# Patient Record
Sex: Female | Born: 1955
Health system: Southern US, Community
[De-identification: ages and names within clinical notes are randomized; demographics above are authoritative.]

## PROBLEM LIST (undated history)

## (undated) DIAGNOSIS — I10 Essential (primary) hypertension: Secondary | ICD-10-CM

## (undated) DIAGNOSIS — Z780 Asymptomatic menopausal state: Secondary | ICD-10-CM

## (undated) DIAGNOSIS — Z87891 Personal history of nicotine dependence: Secondary | ICD-10-CM

## (undated) DIAGNOSIS — E785 Hyperlipidemia, unspecified: Secondary | ICD-10-CM

## (undated) DIAGNOSIS — Z7189 Other specified counseling: Secondary | ICD-10-CM

## (undated) HISTORY — DX: Other specified counseling: Z71.89

## (undated) HISTORY — PX: OTHER SURGICAL HISTORY: SHX169

## (undated) HISTORY — DX: Personal history of nicotine dependence: Z87.891

## (undated) HISTORY — DX: Hyperlipidemia, unspecified: E78.5

## (undated) HISTORY — DX: Asymptomatic menopausal state: Z78.0

---
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
## 2000-08-13 ENCOUNTER — Other Ambulatory Visit: Admission: RE | Admit: 2000-08-13 | Discharge: 2000-08-13 | Payer: Self-pay | Admitting: Family Medicine

## 2001-11-25 ENCOUNTER — Ambulatory Visit (HOSPITAL_BASED_OUTPATIENT_CLINIC_OR_DEPARTMENT_OTHER): Admission: RE | Admit: 2001-11-25 | Discharge: 2001-11-25 | Payer: Self-pay | Admitting: Nurse Practitioner

## 2006-04-29 ENCOUNTER — Ambulatory Visit: Payer: Self-pay | Admitting: Family Medicine

## 2006-04-29 DIAGNOSIS — N951 Menopausal and female climacteric states: Secondary | ICD-10-CM

## 2006-04-29 DIAGNOSIS — L989 Disorder of the skin and subcutaneous tissue, unspecified: Secondary | ICD-10-CM | POA: Insufficient documentation

## 2006-04-29 DIAGNOSIS — E785 Hyperlipidemia, unspecified: Secondary | ICD-10-CM

## 2006-04-29 DIAGNOSIS — D649 Anemia, unspecified: Secondary | ICD-10-CM | POA: Insufficient documentation

## 2006-04-29 HISTORY — DX: Anemia, unspecified: D64.9

## 2006-04-29 HISTORY — DX: Menopausal and female climacteric states: N95.1

## 2006-05-03 ENCOUNTER — Encounter: Payer: Self-pay | Admitting: Family Medicine

## 2006-05-06 LAB — CONVERTED CEMR LAB
ALT: 26 units/L (ref 0–35)
AST: 22 units/L (ref 0–37)
Alkaline Phosphatase: 71 units/L (ref 39–117)
CO2: 26 meq/L (ref 19–32)
Chloride: 103 meq/L (ref 96–112)
HCT: 42.8 % (ref 36.0–46.0)
Potassium: 4.6 meq/L (ref 3.5–5.3)
RBC: 5.01 M/uL (ref 3.87–5.11)
Sodium: 139 meq/L (ref 135–145)
TSH: 1.396 microintl units/mL (ref 0.350–5.50)
Total Bilirubin: 0.4 mg/dL (ref 0.3–1.2)
Total Protein: 7.9 g/dL (ref 6.0–8.3)
WBC: 3.7 10*3/uL — ABNORMAL LOW (ref 4.0–10.5)

## 2006-05-27 ENCOUNTER — Encounter: Payer: Self-pay | Admitting: Family Medicine

## 2006-05-27 ENCOUNTER — Other Ambulatory Visit: Admission: RE | Admit: 2006-05-27 | Discharge: 2006-05-27 | Payer: Self-pay | Admitting: Family Medicine

## 2006-05-27 ENCOUNTER — Ambulatory Visit: Payer: Self-pay | Admitting: Family Medicine

## 2006-06-24 ENCOUNTER — Ambulatory Visit: Payer: Self-pay | Admitting: Gastroenterology

## 2006-07-04 ENCOUNTER — Encounter: Payer: Self-pay | Admitting: Family Medicine

## 2006-07-04 ENCOUNTER — Ambulatory Visit: Payer: Self-pay | Admitting: Gastroenterology

## 2006-07-04 HISTORY — PX: COLONOSCOPY: SHX174

## 2006-07-09 ENCOUNTER — Encounter: Payer: Self-pay | Admitting: Family Medicine

## 2007-04-25 ENCOUNTER — Ambulatory Visit: Payer: Self-pay | Admitting: Family Medicine

## 2007-04-25 DIAGNOSIS — D239 Other benign neoplasm of skin, unspecified: Secondary | ICD-10-CM

## 2007-04-25 HISTORY — DX: Other benign neoplasm of skin, unspecified: D23.9

## 2007-05-01 ENCOUNTER — Telehealth: Payer: Self-pay | Admitting: Family Medicine

## 2007-07-07 ENCOUNTER — Encounter: Payer: Self-pay | Admitting: Family Medicine

## 2007-07-24 ENCOUNTER — Ambulatory Visit: Payer: Self-pay | Admitting: Family Medicine

## 2008-12-24 ENCOUNTER — Other Ambulatory Visit: Admission: RE | Admit: 2008-12-24 | Discharge: 2008-12-24 | Payer: Self-pay | Admitting: Family Medicine

## 2008-12-24 ENCOUNTER — Ambulatory Visit: Payer: Self-pay | Admitting: Family Medicine

## 2008-12-24 DIAGNOSIS — R635 Abnormal weight gain: Secondary | ICD-10-CM | POA: Insufficient documentation

## 2008-12-27 ENCOUNTER — Encounter: Payer: Self-pay | Admitting: Family Medicine

## 2008-12-28 LAB — CONVERTED CEMR LAB
ALT: 43 units/L — ABNORMAL HIGH (ref 0–35)
Alkaline Phosphatase: 84 units/L (ref 39–117)
Cholesterol: 306 mg/dL — ABNORMAL HIGH (ref 0–200)
TSH: 1.566 microintl units/mL (ref 0.350–4.500)
Total Bilirubin: 0.3 mg/dL (ref 0.3–1.2)
Total CHOL/HDL Ratio: 4.9
Triglycerides: 177 mg/dL — ABNORMAL HIGH (ref <150)
VLDL: 35 mg/dL (ref 0–40)

## 2009-01-12 ENCOUNTER — Encounter: Payer: Self-pay | Admitting: Family Medicine

## 2009-01-12 ENCOUNTER — Encounter: Admission: RE | Admit: 2009-01-12 | Discharge: 2009-01-12 | Payer: Self-pay | Admitting: Family Medicine

## 2009-01-14 ENCOUNTER — Encounter: Payer: Self-pay | Admitting: Family Medicine

## 2010-02-07 NOTE — Miscellaneous (Signed)
Summary: Pap smear  Clinical Lists Changes  Observations: Added new observation of PAP DUE: 12/25/2010 (01/14/2009 15:01) Added new observation of TDBOOSTDUE: 05/26/2016 (01/14/2009 15:01) Added new observation of HDLNXTDUE: 12/27/2013 (01/14/2009 15:01) Added new observation of LDLNXTDUE: 12/27/2013 (01/14/2009 15:01) Added new observation of CREATNXTDUE: 12/27/2009 (01/14/2009 15:01) Added new observation of POTASSIUMDUE: 12/27/2009 (01/14/2009 15:01) Added new observation of LAST PAP DAT: 12/24/2008 (12/24/2008 15:02) Added new observation of PAP SMEAR: normal (12/24/2008 15:02)     PAP Result Date:  12/24/2008 PAP Result:  normal PAP Next Due:  2 yr Pls let pt know that her pap smear came back normal.  Seymour Bars, D.O.  Appended Document: Pap smear Pt aware

## 2010-04-20 ENCOUNTER — Encounter: Payer: Self-pay | Admitting: Family Medicine

## 2010-08-17 ENCOUNTER — Encounter: Payer: Self-pay | Admitting: Family Medicine

## 2010-08-17 ENCOUNTER — Ambulatory Visit (INDEPENDENT_AMBULATORY_CARE_PROVIDER_SITE_OTHER): Payer: Managed Care, Other (non HMO) | Admitting: Family Medicine

## 2010-08-17 DIAGNOSIS — R635 Abnormal weight gain: Secondary | ICD-10-CM

## 2010-08-17 DIAGNOSIS — D649 Anemia, unspecified: Secondary | ICD-10-CM

## 2010-08-17 DIAGNOSIS — I10 Essential (primary) hypertension: Secondary | ICD-10-CM

## 2010-08-17 DIAGNOSIS — Z13220 Encounter for screening for lipoid disorders: Secondary | ICD-10-CM

## 2010-08-17 DIAGNOSIS — Z131 Encounter for screening for diabetes mellitus: Secondary | ICD-10-CM

## 2010-08-17 HISTORY — DX: Essential (primary) hypertension: I10

## 2010-08-17 MED ORDER — TRIAMTERENE-HCTZ 37.5-25 MG PO TABS: 1.0000 | ORAL_TABLET | Freq: Every day | ORAL | Status: DC

## 2010-08-17 NOTE — Assessment & Plan Note (Signed)
BP is high today and was high in Dec 2010.  Her weight gain is likely to be a contributing factor.  Will start her on Maxzide today and info given on DASH diet.  Encouraged her to work on diet/ exercise/ wt loss, update labs today and return for her physical in 1 month.

## 2010-08-17 NOTE — Progress Notes (Signed)
  Subjective:    Patient ID: Ashley Haynes, female    DOB: 03/15/1955, 55 y.o.   MRN: [id number]  HPI  55 yo AAF presents for weight gain and elevated BP.  She is out of work.  She went back to school.  Her mom passed away in 04/27/2022.  Her husband is helped out but he has also gained weight.  She is doing some exercise.  She feels like her face turns red when she puts her arms overhead but there is no dizziness or pain.  She is walking or doing workouts at home on and off.  She is about 3 yrs postmenopausal.  She admits to eating worse lately.  She does tend to retain water.  Denies any chest pain or DOE.  Her mom had a CABG at age 18.  Her father has HTN.    BP 154/79  Pulse 68  Ht 4\' 11"  (1.499 m)  Wt 180 lb (81.647 kg)  BMI 36.36 kg/m2  SpO2 98%   Review of Systems  Constitutional: Negative for fatigue and unexpected weight change.  Eyes: Negative for visual disturbance.  Respiratory: Negative for shortness of breath.   Cardiovascular: Negative for chest pain, palpitations and leg swelling.  Genitourinary: Negative for difficulty urinating.  Neurological: Negative for headaches.  Psychiatric/Behavioral: Negative for dysphoric mood. The patient is not nervous/anxious.        Objective:   Physical Exam  Constitutional: She appears well-developed and well-nourished.  HENT:  Mouth/Throat: Oropharynx is clear and moist.  Eyes: Pupils are equal, round, and reactive to light.  Neck: Neck supple. No thyromegaly present.  Cardiovascular: Normal rate, regular rhythm and normal heart sounds.   No murmur heard. Pulmonary/Chest: Effort normal and breath sounds normal.  Musculoskeletal: She exhibits no edema.  Psychiatric: She has a normal mood and affect.          Assessment & Plan:

## 2010-08-17 NOTE — Patient Instructions (Signed)
Read thru info on DASH diet to work on diet/ exercise changes.  Start Maxzide once daily for high BP.  Aim for walking 45 min 4 days/ wk.  Labs today. Will call you w/ results tomorrow.  Return for a PHYSICAL in 1 month.

## 2010-08-18 ENCOUNTER — Telehealth: Payer: Self-pay | Admitting: Family Medicine

## 2010-08-18 LAB — CBC WITH DIFFERENTIAL/PLATELET
Basophils Relative: 0 % (ref 0–1)
Eosinophils Absolute: 0.1 10*3/uL (ref 0.0–0.7)
Lymphocytes Relative: 53 % — ABNORMAL HIGH (ref 12–46)
MCHC: 33 g/dL (ref 30.0–36.0)
MCV: 83.8 fL (ref 78.0–100.0)
Monocytes Absolute: 0.4 10*3/uL (ref 0.1–1.0)
Monocytes Relative: 9 % (ref 3–12)
Neutro Abs: 1.6 10*3/uL — ABNORMAL LOW (ref 1.7–7.7)
RBC: 4.99 MIL/uL (ref 3.87–5.11)
RDW: 15.3 % (ref 11.5–15.5)

## 2010-08-18 LAB — LIPID PANEL
Cholesterol: 266 mg/dL — ABNORMAL HIGH (ref 0–200)
LDL Cholesterol: 188 mg/dL — ABNORMAL HIGH (ref 0–99)
Triglycerides: 113 mg/dL (ref <150)
VLDL: 23 mg/dL (ref 0–40)

## 2010-08-18 LAB — COMPLETE METABOLIC PANEL WITH GFR
Alkaline Phosphatase: 73 U/L (ref 39–117)
CO2: 28 mEq/L (ref 19–32)
Calcium: 9.6 mg/dL (ref 8.4–10.5)
Chloride: 102 mEq/L (ref 96–112)
Creat: 0.73 mg/dL (ref 0.50–1.10)
Glucose, Bld: 91 mg/dL (ref 70–99)
Potassium: 4.4 mEq/L (ref 3.5–5.3)
Sodium: 137 mEq/L (ref 135–145)

## 2010-08-18 LAB — TSH: TSH: 1.455 u[IU]/mL (ref 0.350–4.500)

## 2010-08-18 LAB — HEMOGLOBIN A1C: Hgb A1c MFr Bld: 6.6 % — ABNORMAL HIGH (ref <5.7)

## 2010-08-18 NOTE — Telephone Encounter (Signed)
Pt notified of results and sent to scheduling. KJ LPN

## 2010-08-18 NOTE — Telephone Encounter (Signed)
Pls let pt know that her blood counts, liver and kidney function came back normal.  Her cholesterol is HIGH and her A1C screen for diabetes is 6.6 which is in the DIABETIC range.  Thyroid function is normal.  Set up OV in 1-2 wks to discuss treatment for diabetes and high cholesterol.

## 2010-08-23 ENCOUNTER — Encounter: Payer: Self-pay | Admitting: Family Medicine

## 2010-08-30 ENCOUNTER — Encounter: Payer: Self-pay | Admitting: Family Medicine

## 2010-08-31 ENCOUNTER — Ambulatory Visit (INDEPENDENT_AMBULATORY_CARE_PROVIDER_SITE_OTHER): Payer: Managed Care, Other (non HMO) | Admitting: Family Medicine

## 2010-08-31 ENCOUNTER — Encounter: Payer: Self-pay | Admitting: Family Medicine

## 2010-08-31 VITALS — BP 164/81 | HR 77 | Ht 59.0 in | Wt 179.0 lb

## 2010-08-31 DIAGNOSIS — I10 Essential (primary) hypertension: Secondary | ICD-10-CM

## 2010-08-31 DIAGNOSIS — E119 Type 2 diabetes mellitus without complications: Secondary | ICD-10-CM | POA: Insufficient documentation

## 2010-08-31 DIAGNOSIS — E785 Hyperlipidemia, unspecified: Secondary | ICD-10-CM

## 2010-08-31 MED ORDER — ROSUVASTATIN CALCIUM 10 MG PO TABS: 10.0000 mg | ORAL_TABLET | Freq: Every day | ORAL | Status: DC

## 2010-08-31 MED ORDER — AMBULATORY NON FORMULARY MEDICATION: Status: DC

## 2010-08-31 NOTE — Assessment & Plan Note (Signed)
BP high today but she has yet to take her Maxzide today.  Advised her to restart it and check BP at CPE in 1 month.  Now that she has newly diagnosed T2DM, would consider changing her to an ACEi.  Labs are UTD.

## 2010-08-31 NOTE — Patient Instructions (Signed)
Start Crestor 10 mg at bedtime each night for high cholesterol  Nutritionist referral made.  Check AM fasting or 2 hr after meal blood sugars.  AM fasting goal is 70-100.  2 hrs after  Meal goal is <150.  Work on regular exercise, low sugar/ low carb diet.  Call if any problems.  Schedule physical in 1 month.

## 2010-08-31 NOTE — Assessment & Plan Note (Signed)
D/w pt this new diagnosis based on a1C of 6.6 on labs.  I was surprised to see a normal fasting glucose.  She likely has higher postprandials.  I gave her a new meter today to start checking AM fastings and 2 hr PPs at home.  RTC for f/u.  Nutritionist referral made to start working on lifestyle changes.  i have not started her on meds today.  She will need a monofilament, urine micro, pneumovax, flu shot and eye exam this year if her A1C stays > 6.5.

## 2010-08-31 NOTE — Assessment & Plan Note (Signed)
LDL was 188 with a goal of <100.  Will start Crestor 10 mg qhs.  Repeat LDL and LFTs after 8 wks.  Work on [employer], regular exercise, wt loss given BMI of 36.  Call if any problems on new medicine.

## 2010-08-31 NOTE — Progress Notes (Signed)
  Subjective:    Patient ID: Ashley Haynes, female    DOB: 1955/11/07, 55 y.o.   MRN: [id number]  HPI  55 yo AAF presents for f/u after having labs drawn on 8-9.  She was just started on Maxzide but has not taking her meds today.  Her A1C was 6.6 and she has never had a dx of IFG or T2DM.  Her mom had diabetes and heart dz in her 65s.  She admits to a fairly poor diet and had just recently started to exercise.  Her cholesterol was also high and she is not taking anything for this.  BP 164/81  Pulse 77  Wt 179 lb (81.194 kg)   Review of Systems  Constitutional: Negative for fatigue.  Respiratory: Negative for shortness of breath.   Cardiovascular: Negative for chest pain, palpitations and leg swelling.  Neurological: Negative for headaches.       Objective:   Physical Exam  Constitutional: She appears well-developed and well-nourished. No distress.       obese  Psychiatric: She has a normal mood and affect.          Assessment & Plan:

## 2010-09-13 ENCOUNTER — Encounter: Payer: Managed Care, Other (non HMO) | Admitting: Family Medicine

## 2010-09-28 ENCOUNTER — Telehealth: Payer: Self-pay | Admitting: Family Medicine

## 2010-09-28 ENCOUNTER — Other Ambulatory Visit: Payer: Self-pay | Admitting: Family Medicine

## 2010-09-28 MED ORDER — AMBULATORY NON FORMULARY MEDICATION: Status: DC

## 2010-09-28 NOTE — Telephone Encounter (Signed)
Pt called and left mess for triage nurse that she needs test strips sent to pharm.  Send to [facility]. Plan:  #100/3 refills given of test strips for one touch. Jarvis Newcomer, LPN [facility] D[facility]

## 2010-09-29 NOTE — Telephone Encounter (Signed)
Closed

## 2010-10-10 ENCOUNTER — Encounter: Payer: Managed Care, Other (non HMO) | Admitting: Family Medicine

## 2010-10-10 DIAGNOSIS — Z0289 Encounter for other administrative examinations: Secondary | ICD-10-CM

## 2011-02-04 IMAGING — OT DG DXA BONE DENSITY STUDY HL7
2 series · 2 of 2 positions shown · non-contrast
Comparison: None

CLINICAL DATA: 53-year-old postmenopausal female

[Series 1: — · left · 1 of 1 slices shown (1 of 2)]
[im 1/1]
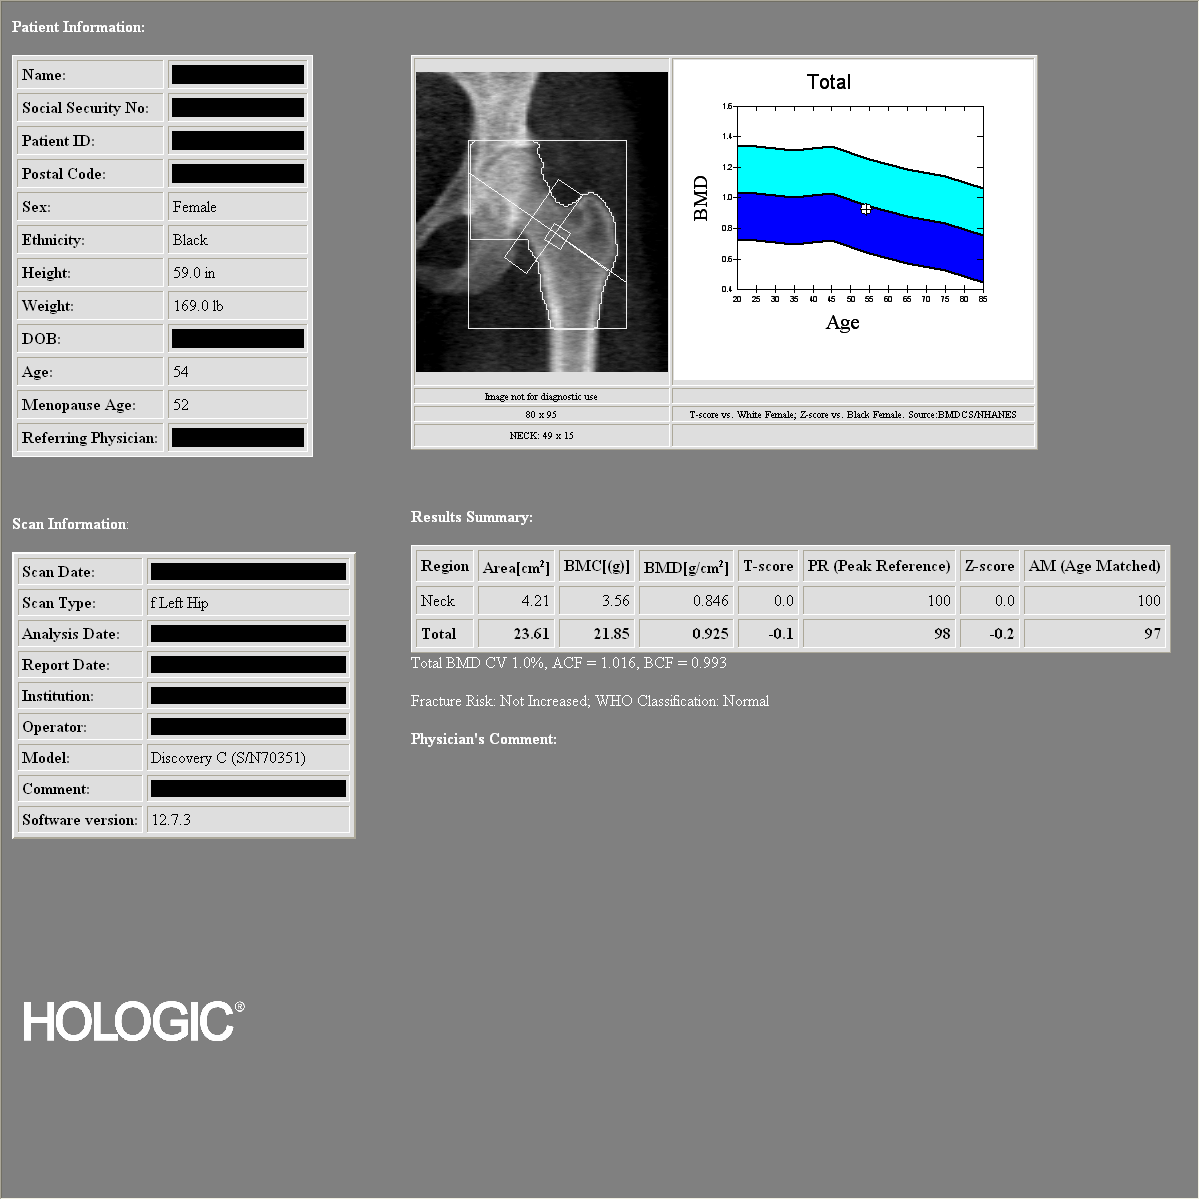

[Series 2: — · 1 of 1 slices shown (2 of 2)]
[im 1/1]
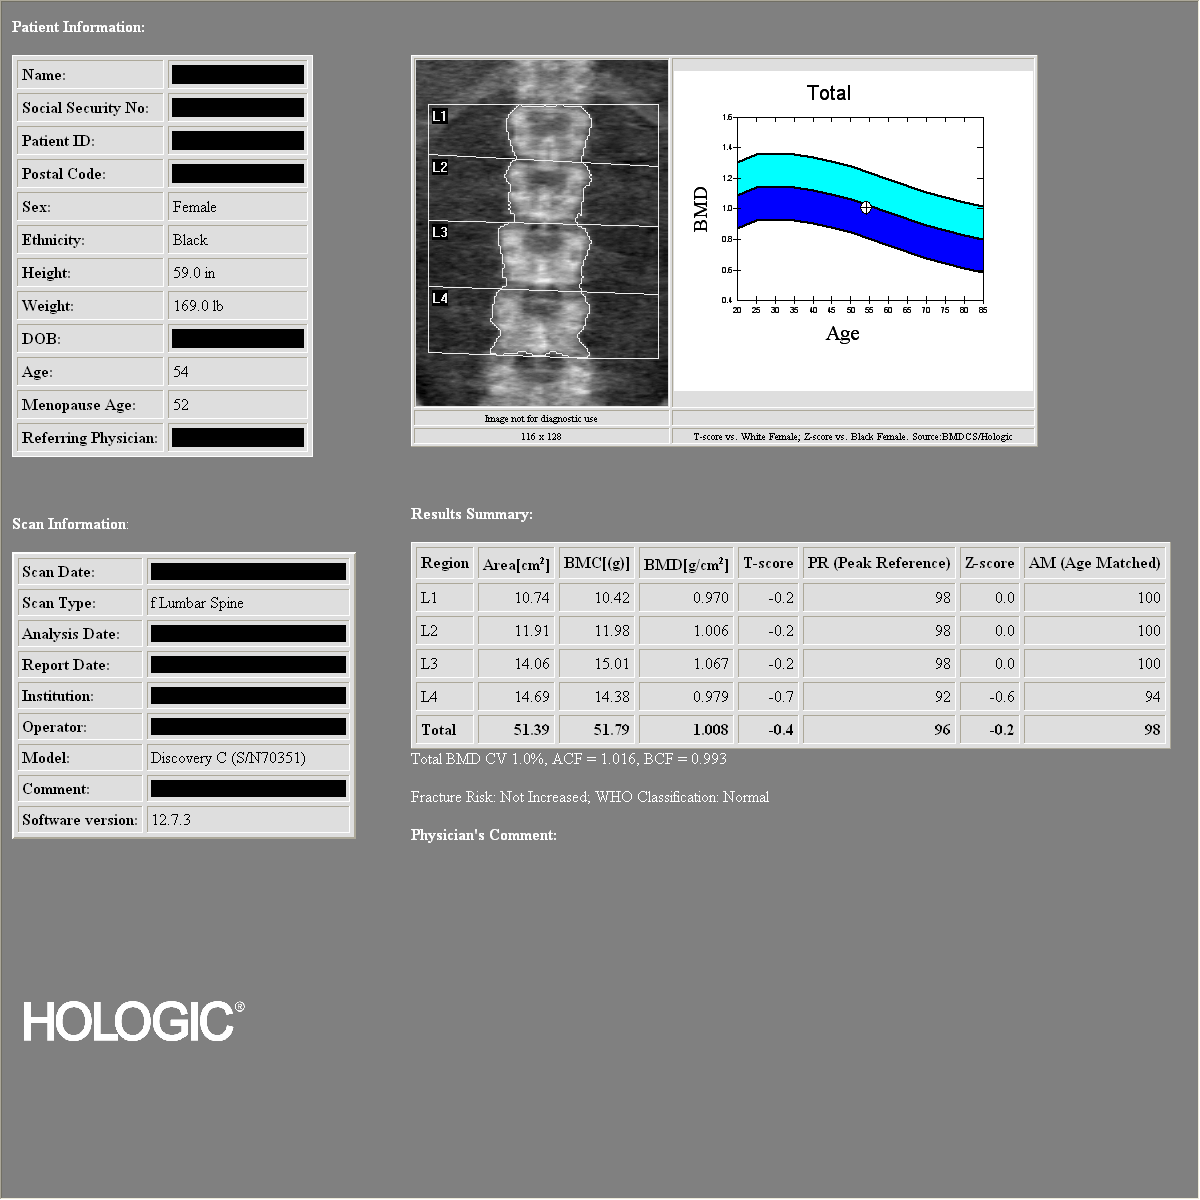

[2 of 2 positions shown; findings below may reference images not displayed]

DUAL X-RAY ABSORPTIOMETRY (DXA) FOR BONE MINERAL DENSITY

AP LUMBAR SPINE (OF ONE - L4)

Bone Mineral Density (BMD):            1.008 g/cm2
Young Adult T Score:                          -0.4
Z Score:                                                -0.2

LEFT FEMUR TOTAL

Bone Mineral Density (BMD):             0.925 g/cm2
Young Adult T Score:                           -0.1
Z Score:                                                 -0.2

ASSESSMENT:  Patient's diagnostic category is NORMAL by WHO
Criteria.

FRACTURE RISK: NOT INCREASED

FRAX: World Health Organization FRAX assessment of absolute
fracture risk is not calculated for this patient because the
patient has a normal study.
RECOMMENDATIONS:

Effective therapies are available in the form of bisphosphonates,
selective estrogen receptor modulators, biologic agents, and
hormone replacement therapy (for women).  All patients should
ensure an adequate intake of dietary calcium (1200mg daily) and
vitamin D (800 Marlin Luisa) unless contraindicated.

All treatment decisions require clinical judgement and
consideration of individual patient factors, including patient
preferences, co-morbidities, previous drug use, risk factors not
captured in the FRAX model (e.g., frailty, falls, vitamin D
deficiency, increased bone turnover, interval significant decline
in bone density) and possible under-or over-estimation of fracture
risk by FRAX.

The National Osteoporosis Foundation recommends that FDA-approved
medical therapies be considered in postmenopausal women and mean
age 50 or older with a:

      1)     Hip or vertebral (clinical or morphometric) fracture.

2)    T-score of -2.5 or lower at the spine or hip.
3)    Ten-year fracture probability by FRAX of 3% or greater for
hip fracture or 20% or greater for major osteoporotic fracture.
FOLLOW-UP:

People with diagnosed cases of osteoporosis or at high risk for
fracture should have regular bone mineral density tests.  For
patients eligible for Medicare, routine testing is allowed once
every 2 years.  The testing frequency can be increased to one year
for patients who have rapidly progressing disease, those who are
receiving or discontinuing medical therapy to restore bone mass, or
have additional risk factors.

World Health Organization (WHO) Criteria:

Normal: T scores from +1.0 to -1.0
Low Bone Mass (Osteopenia): T scores between -1.0 and -2.5
Osteoporosis: T scores -2.5 and below

Comparison to Reference Population:

T score is the key measure used in the diagnosis of osteoporosis
and relative risk determination for fracture.  It provides a value
for bone mass relative to the mean bone mass of a young adult
reference population expressed in terms of standard deviation (SD).

Z score is the age-matched score showing the patient's values
compared to a population matched for age, sex, and race.  This is
also expressed in terms of standard deviation.  The patient may
have values that compare favorably to the age-matched values and
still be at increased risk for fracture.

## 2014-11-30 ENCOUNTER — Ambulatory Visit (INDEPENDENT_AMBULATORY_CARE_PROVIDER_SITE_OTHER): Payer: Managed Care, Other (non HMO) | Admitting: Osteopathic Medicine

## 2014-11-30 ENCOUNTER — Encounter: Payer: Self-pay | Admitting: Osteopathic Medicine

## 2014-11-30 VITALS — BP 163/70 | HR 79 | Ht 59.0 in | Wt 178.0 lb

## 2014-11-30 DIAGNOSIS — Z23 Encounter for immunization: Secondary | ICD-10-CM

## 2014-11-30 DIAGNOSIS — Z79899 Other long term (current) drug therapy: Secondary | ICD-10-CM

## 2014-11-30 DIAGNOSIS — Z87891 Personal history of nicotine dependence: Secondary | ICD-10-CM | POA: Diagnosis not present

## 2014-11-30 DIAGNOSIS — E119 Type 2 diabetes mellitus without complications: Secondary | ICD-10-CM

## 2014-11-30 DIAGNOSIS — I10 Essential (primary) hypertension: Secondary | ICD-10-CM | POA: Diagnosis not present

## 2014-11-30 DIAGNOSIS — Z139 Encounter for screening, unspecified: Secondary | ICD-10-CM

## 2014-11-30 HISTORY — DX: Personal history of nicotine dependence: Z87.891

## 2014-11-30 LAB — COMPLETE METABOLIC PANEL WITH GFR
ALBUMIN: 4.1 g/dL (ref 3.6–5.1)
ALT: 21 U/L (ref 6–29)
AST: 21 U/L (ref 10–35)
Alkaline Phosphatase: 69 U/L (ref 33–130)
BUN: 13 mg/dL (ref 7–25)
CALCIUM: 9.3 mg/dL (ref 8.6–10.4)
CHLORIDE: 102 mmol/L (ref 98–110)
CO2: 26 mmol/L (ref 20–31)
CREATININE: 0.66 mg/dL (ref 0.50–0.99)
GFR, Est African American: >89 mL/min (ref >=60)
GFR, Est Non African American: >89 mL/min (ref >=60)
Glucose, Bld: 85 mg/dL (ref 65–99)
Potassium: 4.4 mmol/L (ref 3.5–5.3)
Sodium: 137 mmol/L (ref 135–146)
Total Bilirubin: 0.4 mg/dL (ref 0.2–1.2)
Total Protein: 7.6 g/dL (ref 6.1–8.1)

## 2014-11-30 LAB — CBC WITH DIFFERENTIAL/PLATELET
BASOS ABS: 0 10*3/uL (ref 0.0–0.1)
Basophils Relative: 0 % (ref 0–1)
Eosinophils Absolute: 0.1 10*3/uL (ref 0.0–0.7)
Eosinophils Relative: 2 % (ref 0–5)
HEMATOCRIT: 41.8 % (ref 36.0–46.0)
HEMOGLOBIN: 13.8 g/dL (ref 12.0–15.0)
LYMPHS PCT: 52 % — AB (ref 12–46)
Lymphs Abs: 2.7 10*3/uL (ref 0.7–4.0)
MCH: 27.2 pg (ref 26.0–34.0)
MCHC: 33 g/dL (ref 30.0–36.0)
MCV: 82.4 fL (ref 78.0–100.0)
MPV: 9.5 fL (ref 8.6–12.4)
Monocytes Absolute: 0.3 10*3/uL (ref 0.1–1.0)
Monocytes Relative: 6 % (ref 3–12)
NEUTROS ABS: 2 10*3/uL (ref 1.7–7.7)
Neutrophils Relative %: 40 % — ABNORMAL LOW (ref 43–77)
Platelets: 246 10*3/uL (ref 150–400)
RBC: 5.07 MIL/uL (ref 3.87–5.11)
RDW: 15 % (ref 11.5–15.5)
WBC: 5.1 10*3/uL (ref 4.0–10.5)

## 2014-11-30 LAB — LIPID PANEL
Cholesterol: 265 mg/dL — ABNORMAL HIGH (ref 125–200)
HDL: 56 mg/dL (ref >=46)
LDL CALC: 191 mg/dL — AB (ref <130)
Total CHOL/HDL Ratio: 4.7 Ratio (ref <=5.0)
Triglycerides: 92 mg/dL (ref <150)
VLDL: 18 mg/dL (ref <30)

## 2014-11-30 LAB — HEMOGLOBIN A1C
Hgb A1c MFr Bld: 6.2 % — ABNORMAL HIGH (ref <5.7)
Mean Plasma Glucose: 131 mg/dL — ABNORMAL HIGH (ref <117)

## 2014-11-30 MED ORDER — LISINOPRIL 10 MG PO TABS: 10.0000 mg | ORAL_TABLET | Freq: Every day | ORAL | Status: DC
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
## 2014-11-30 NOTE — Patient Instructions (Signed)
We will discuss blood work results next visit, please come back in one week for Pap test and to recheck blood pressure and review labs.  Any questions or concerns in the meantime, please call the office!  Thanks! Dr. Loni Muse.

## 2014-12-01 LAB — HEPATITIS C ANTIBODY: HCV AB: NEGATIVE

## 2014-12-01 LAB — TSH: TSH: 1.79 u[IU]/mL (ref 0.350–4.500)

## 2014-12-01 LAB — HIV ANTIBODY (ROUTINE TESTING W REFLEX): HIV 1&2 Ab, 4th Generation: NONREACTIVE

## 2014-12-07 ENCOUNTER — Ambulatory Visit (INDEPENDENT_AMBULATORY_CARE_PROVIDER_SITE_OTHER): Payer: Managed Care, Other (non HMO) | Admitting: Osteopathic Medicine

## 2014-12-07 ENCOUNTER — Other Ambulatory Visit (HOSPITAL_COMMUNITY)
Admission: RE | Admit: 2014-12-07 | Discharge: 2014-12-07 | Disposition: A | Discharge status: Home / Self Care | Payer: Managed Care, Other (non HMO) | Source: Ambulatory Visit | Attending: Osteopathic Medicine | Admitting: Osteopathic Medicine

## 2014-12-07 ENCOUNTER — Encounter: Payer: Self-pay | Admitting: Osteopathic Medicine

## 2014-12-07 VITALS — BP 157/67 | HR 88 | Ht 59.0 in | Wt 179.0 lb

## 2014-12-07 DIAGNOSIS — I10 Essential (primary) hypertension: Secondary | ICD-10-CM

## 2014-12-07 DIAGNOSIS — Z124 Encounter for screening for malignant neoplasm of cervix: Secondary | ICD-10-CM

## 2014-12-07 DIAGNOSIS — E785 Hyperlipidemia, unspecified: Secondary | ICD-10-CM | POA: Diagnosis not present

## 2014-12-07 DIAGNOSIS — Z01419 Encounter for gynecological examination (general) (routine) without abnormal findings: Secondary | ICD-10-CM | POA: Diagnosis present

## 2014-12-07 DIAGNOSIS — E119 Type 2 diabetes mellitus without complications: Secondary | ICD-10-CM | POA: Diagnosis not present

## 2014-12-07 DIAGNOSIS — Z113 Encounter for screening for infections with a predominantly sexual mode of transmission: Secondary | ICD-10-CM | POA: Diagnosis present

## 2014-12-07 DIAGNOSIS — N760 Acute vaginitis: Secondary | ICD-10-CM | POA: Diagnosis present

## 2014-12-07 DIAGNOSIS — Z1151 Encounter for screening for human papillomavirus (HPV): Secondary | ICD-10-CM | POA: Insufficient documentation

## 2014-12-07 MED ORDER — LISINOPRIL 10 MG PO TABS: 20.0000 mg | ORAL_TABLET | Freq: Every day | ORAL | Status: DC

## 2014-12-07 MED ORDER — GLUCOSE BLOOD VI STRP: ORAL_STRIP | Status: DC

## 2014-12-07 NOTE — Patient Instructions (Signed)
Increase lisinopril to 2 pills daily, 20 mg. Come back for a nurse visit to repeat blood pressure check in 2 weeks. Come back for a visit with Dr. Sheppard Coil in 3 months to repeat labs. Orders have been present to repeat A1c and cholesterol, please get these done a few days prior to visit and we can go over the results at that time.

## 2014-12-07 NOTE — Progress Notes (Signed)
HPI: Ashley Haynes is a 59 y.o. female who presents to [facility]  today for chief complaint of: No chief complaint on file.  Diabetes: A1c 4 years ago was 6.6 most recent A1c last week was 6.2, indicating either diet-controlled diabetes or prediabetes, we'll repeat A1c in 3 months, this was explained to the patient.   Preventive Care: Patient due for Pap test  Hypertension: This is a patient was started on blood pressure medications for BP 163/70, had been creeping up over the past few years. No chest pain pressure palpitations. Blood pressure today improved  Past medical, social and family history reviewed: Past Medical History  Diagnosis Date  . Menopause age 18  . Former smoker 11/30/2014    Quit 09/1988   Past Surgical History  Procedure Laterality Date  . Ltcs     Social History  Substance Use Topics  . Smoking status: Never Smoker   . Smokeless tobacco: Not on file  . Alcohol Use: No   Family History  Problem Relation Age of Onset  . Heart disease Mother 51    MI  . Diabetes Mother   . Glaucoma Mother   . Hyperlipidemia Sister     Current Outpatient Prescriptions  Medication Sig Dispense Refill  . lisinopril (PRINIVIL,ZESTRIL) 10 MG tablet Take 1 tablet (10 mg total) by mouth daily. 30 tablet 1   No current facility-administered medications for this visit.   Not on File    Review of Systems: CONSTITUTIONAL:  No  fever, no chills, No  unintentional weight changes CARDIAC: No chest pain, no pressure/palpitations, no orthopnea RESPIRATORY: No  cough, No  shortness of breath/wheeze GASTROINTESTINAL: No nausea, no vomiting, no abdominal pain, no blood in stool, no diarrhea, no constipation ENDOCRINE: No polyuria/polydipsia/polyphagia, no heat/cold intolerance   Exam:  BP 157/67 mmHg  Pulse 88  Ht 4\' 11"  (1.499 m)  Wt 179 lb (81.194 kg)  BMI 36.13 kg/m2 Constitutional: VSS, see above. General Appearance: alert,  well-developed, well-nourished, NAD GYN: No lesions/ulcers to external genitalia, normal urethra, normal vaginal mucosa, physiologic discharge, cervix normal without lesions, uterus not enlarged or tender, adnexa no masses and nontender    No results found for this or any previous visit (from the past 48 hour(s)). Recent lab results reviewed with the patient, all questions answered, A1c shows improvement despite lack of medication therapy for the past 4 years, cholesterol elevated, CBC and CMP no concerns   ASSESSMENT/PLAN: Pap test obtained today, we'll repeat A1c in another 3 months to establish whether prediabetes versus diabetes. Increase lisinopril to 20 mg and nurse visit to recheck blood pressure in 2 weeks.   Cervical cancer screening - Plan: Cytology - PAP, Cytology - PAP  Essential hypertension, benign - Plan: lisinopril (PRINIVIL,ZESTRIL) 10 MG tablet increase to 2 tabs daily, nurse visit in 2 weeks to recheck blood pressure on 20 mg dose  Hyperlipidemia - Plan: Lipid panel, patient counseled on possibility of statin to treat hyperlipidemia, would like to try changes with diet and exercise first  Type 2 diabetes mellitus without complication, without long-term current use of insulin (HCC) versus prediabetes  - Plan: Hemoglobin A1C In 3 months, patient counseled on possibility of metformin to treat prediabetes, she would like to try let us changes with diet and exercise first    Return in about 2 weeks (around 12/21/2014) for NURSE VISIT BP CHECK, and 3 MONTHS FOLLOWUP WITH DR. A FOR REPEAT LABS.

## 2014-12-09 LAB — CERVICOVAGINAL ANCILLARY ONLY
BACTERIAL VAGINITIS: NEGATIVE
CANDIDA VAGINITIS: NEGATIVE

## 2014-12-09 LAB — CYTOLOGY - PAP

## 2014-12-21 ENCOUNTER — Ambulatory Visit: Payer: Managed Care, Other (non HMO)

## 2014-12-22 ENCOUNTER — Ambulatory Visit (INDEPENDENT_AMBULATORY_CARE_PROVIDER_SITE_OTHER): Payer: Managed Care, Other (non HMO) | Admitting: Osteopathic Medicine

## 2014-12-22 VITALS — BP 149/91 | HR 83

## 2014-12-22 DIAGNOSIS — I10 Essential (primary) hypertension: Secondary | ICD-10-CM

## 2014-12-22 NOTE — Progress Notes (Signed)
Advised nurse tell pt needs to take BP meds as directed, RTC for repeat check within the week.

## 2014-12-22 NOTE — Progress Notes (Signed)
   Subjective:    Patient ID: Ashley Haynes, female    DOB: July 11, 1955, 59 y.o.   MRN: [id number]  HPI  Patient comes in for BP check Hasn't taken her BP meds for the past 4 days, since her husband has been in the hospital  Review of Systems     Objective:   Physical Exam        Assessment & Plan:   BP 149/91 mmHg  Pulse 83  Will have patient take her lisinopril consistently for the next few days and will come back for another BP recheck on Friday.

## 2014-12-24 ENCOUNTER — Ambulatory Visit: Payer: Managed Care, Other (non HMO)

## 2015-03-08 ENCOUNTER — Ambulatory Visit (INDEPENDENT_AMBULATORY_CARE_PROVIDER_SITE_OTHER): Payer: Managed Care, Other (non HMO) | Admitting: Osteopathic Medicine

## 2015-03-08 VITALS — BP 157/59 | HR 68 | Ht 59.0 in | Wt 173.0 lb

## 2015-03-08 DIAGNOSIS — R7303 Prediabetes: Secondary | ICD-10-CM | POA: Diagnosis not present

## 2015-03-08 DIAGNOSIS — R7301 Impaired fasting glucose: Secondary | ICD-10-CM | POA: Insufficient documentation

## 2015-03-08 DIAGNOSIS — E669 Obesity, unspecified: Secondary | ICD-10-CM

## 2015-03-08 DIAGNOSIS — E785 Hyperlipidemia, unspecified: Secondary | ICD-10-CM | POA: Diagnosis not present

## 2015-03-08 DIAGNOSIS — I10 Essential (primary) hypertension: Secondary | ICD-10-CM

## 2015-03-08 DIAGNOSIS — Z7189 Other specified counseling: Secondary | ICD-10-CM

## 2015-03-08 HISTORY — DX: Impaired fasting glucose: R73.01

## 2015-03-08 HISTORY — DX: Prediabetes: R73.03

## 2015-03-08 LAB — LIPID PANEL
CHOL/HDL RATIO: 4.8 ratio (ref <=5.0)
CHOLESTEROL: 257 mg/dL — AB (ref 125–200)
HDL: 54 mg/dL (ref >=46)
LDL CALC: 182 mg/dL — AB (ref <130)
Triglycerides: 103 mg/dL (ref <150)
VLDL: 21 mg/dL (ref <30)

## 2015-03-08 LAB — POCT GLYCOSYLATED HEMOGLOBIN (HGB A1C)
HEMOGLOBIN A1C: 6
Hemoglobin A1C: 69

## 2015-03-08 NOTE — Addendum Note (Signed)
Addended by: Doree Albee on: 03/08/2015 11:24 AM   Modules accepted: Orders

## 2015-03-08 NOTE — Progress Notes (Signed)
HPI: Ashley Haynes is a 60 y.o. female who presents to [facility]  today for chief complaint of:  Chief Complaint  Patient presents with  . Follow-up  . Hypertension  . Hyperglycemia   Diabetes/hyperglycemia: A1c 4 years ago was 6.6 most recent A1c three mos ago was 6.2, indicating either diet-controlled diabetes or prediabetes, we'll repeat A1c today, see results below. Pt declined metformin treatment last visit.   Hypertension: patient was started few months ago on blood pressure medications for BP 163/70, had been creeping up over the past few years. No chest pain pressure palpitations today. Last BP check in office after increasing meds was high but pt hadn't been taking meds, she did not follow up for repeat nurse visit BP check.. Blood pressure today also uncontrolled BP but again pt hasn't taken her medications.   Hyperlipidemia - pt opted last visit to try diet/exercise for lipid control.   Obesity - patient reports that her husband is also a diabetic, he is using an Accu-Chek of what she is eating throughout the day, however she is usually pretty good at breakfast but then doesn't attention to what she had her husband are eating for Center, he typically takes care of dinner, high carb meals.   Past medical, social and family history reviewed: Past Medical History  Diagnosis Date  . Menopause age 67  . Former smoker 11/30/2014    Quit 09/1988   Past Surgical History  Procedure Laterality Date  . Ltcs     Social History  Substance Use Topics  . Smoking status: Never Smoker   . Smokeless tobacco: Not on file  . Alcohol Use: No   Family History  Problem Relation Age of Onset  . Heart disease Mother 68    MI  . Diabetes Mother   . Glaucoma Mother   . Hyperlipidemia Sister     Current Outpatient Prescriptions  Medication Sig Dispense Refill  . glucose blood test strip Use as instructed 100 each 12  . lisinopril (PRINIVIL,ZESTRIL) 10  MG tablet Take 2 tablets (20 mg total) by mouth daily. 30 tablet 1   No current facility-administered medications for this visit.   No Known Allergies    Review of Systems: CONSTITUTIONAL:  No  fever, no chills, No  unintentional weight changes CARDIAC: No chest pain, no pressure/palpitations, no orthopnea RESPIRATORY: No  cough, No  shortness of breath/wheeze GASTROINTESTINAL: No nausea, no vomiting, no abdominal pain ENDOCRINE: No polyuria/polydipsia/polyphagia, no heat/cold intolerance   Exam:  BP 157/59 mmHg  Pulse 68  Ht 4\' 11"  (1.499 m)  Wt 173 lb (78.472 kg)  BMI 34.92 kg/m2 Constitutional: VSS, see above. General Appearance: alert, well-developed, well-nourished, NAD Eyes: Normal lids and conjunctive, non-icteric sclera,  Respiratory: Normal respiratory effort. no wheeze, no rhonchi, no rales Cardiovascular: S1/S2 normal, no murmur, no rub/gallop auscultated. RRR.  Psychiatric: Normal judgment/insight. Normal mood and affect.    Results for orders placed or performed in visit on 03/08/15 (from the past 72 hour(s))  POCT HgB A1C     Status: None   Collection Time: 03/08/15  8:56 AM  Result Value Ref Range   Hemoglobin A1C 69.0    Recent lab results reviewed: A1c shows improvement despite lack of medication therapy for the past 4 years, cholesterol elevated, CBC and CMP no concerns   ASSESSMENT/PLAN:   Prediabetes - patient given information on carbohydrate counting, again declines metformin therapy. Check 3 months - Plan: POCT HgB A1C  Essential hypertension, benign - Patient advised she needs to take her medications regularly, even if she has doctor visit, okay to take these with water and still be considered fasting  Hyperlipidemia - We'll recheck today, LDL was high at last check, patient minimally compliant with low fat diet - Plan: Lipid panel  Obesity - Counseled on diet and exercise, carbohydrate counting    Return in about 3 months (around 06/05/2015), or  sooner if needed, for PRE-DIABETES AND BLOOD PRESSURE.

## 2015-03-08 NOTE — Patient Instructions (Signed)

## 2015-03-11 ENCOUNTER — Encounter: Payer: Self-pay | Admitting: Osteopathic Medicine

## 2015-03-11 DIAGNOSIS — Z7189 Other specified counseling: Secondary | ICD-10-CM

## 2015-03-11 HISTORY — DX: Other specified counseling: Z71.89

## 2015-03-11 MED ORDER — ATORVASTATIN CALCIUM 40 MG PO TABS: 40.0000 mg | ORAL_TABLET | Freq: Every day | ORAL | Status: DC

## 2015-03-11 NOTE — Addendum Note (Signed)
Addended by: Maryla Morrow on: 03/11/2015 11:41 AM   Modules accepted: Orders

## 2015-06-07 ENCOUNTER — Ambulatory Visit (INDEPENDENT_AMBULATORY_CARE_PROVIDER_SITE_OTHER): Payer: Managed Care, Other (non HMO) | Admitting: Osteopathic Medicine

## 2015-06-07 ENCOUNTER — Encounter: Payer: Self-pay | Admitting: Osteopathic Medicine

## 2015-06-07 VITALS — BP 140/80 | HR 75 | Wt 175.0 lb

## 2015-06-07 DIAGNOSIS — I10 Essential (primary) hypertension: Secondary | ICD-10-CM | POA: Diagnosis not present

## 2015-06-07 DIAGNOSIS — R7303 Prediabetes: Secondary | ICD-10-CM | POA: Diagnosis not present

## 2015-06-07 DIAGNOSIS — Z79899 Other long term (current) drug therapy: Secondary | ICD-10-CM

## 2015-06-07 DIAGNOSIS — E785 Hyperlipidemia, unspecified: Secondary | ICD-10-CM

## 2015-06-07 LAB — POCT GLYCOSYLATED HEMOGLOBIN (HGB A1C): HEMOGLOBIN A1C: 5.9

## 2015-06-07 MED ORDER — LISINOPRIL 30 MG PO TABS: 30.0000 mg | ORAL_TABLET | Freq: Every day | ORAL | Status: DC

## 2015-06-07 NOTE — Progress Notes (Signed)
HPI: Ashley Haynes is a 60 y.o. female who presents to [facility]  today for chief complaint of:  Chief Complaint  Patient presents with  . Prediabetes  . Hypertension   Diabetes/hyperglycemia: A1c 4 years ago was 6.6 most recent A1c three mos ago was 6.0 (improved from 6.2 six months ago), indicating diet-controlled prediabetes, though records show A1C was 6.6 four years ago.   Hypertension: patient was started few months ago on blood pressure medications for BP 163/70, had been creeping up over the past few years. No chest pain pressure palpitations today. Last BP checks in office high but patient admitted not taking her medications prior to visit.   Hyperlipidemia - pt had opted to try diet/exercise for lipid control. Lipids still elevated on recheck 02/2015 so started Lipitor. Pt declines lipid recheck today     Past medical, social and family history reviewed: Past Medical History  Diagnosis Date  . Menopause age 66  . Former smoker 11/30/2014    Quit 09/1988  . Cardiac risk counseling 03/11/2015    Ten-year ASCVD risk 15.1% (calculated 03/11/2015) given hyperlipidemia, moderately well-controlled blood pressure, age. Prediabetes is also a concern, as well as increased BMI   Past Surgical History  Procedure Laterality Date  . Ltcs     Social History  Substance Use Topics  . Smoking status: Never Smoker   . Smokeless tobacco: Not on file  . Alcohol Use: No   Family History  Problem Relation Age of Onset  . Heart disease Mother 29    MI  . Diabetes Mother   . Glaucoma Mother   . Hyperlipidemia Sister     Current Outpatient Prescriptions  Medication Sig Dispense Refill  . atorvastatin (LIPITOR) 40 MG tablet Take 1 tablet (40 mg total) by mouth daily. 90 tablet 3  . glucose blood test strip Use as instructed 100 each 12  . lisinopril (PRINIVIL,ZESTRIL) 10 MG tablet Take 2 tablets (20 mg total) by mouth daily. 30 tablet 1   No current  facility-administered medications for this visit.   No Known Allergies    Review of Systems: CONSTITUTIONAL:  No  fever, no chills, No  unintentional weight changes CARDIAC: No chest pain, no pressure/palpitations, no orthopnea RESPIRATORY: No  cough, No  shortness of breath/wheeze GASTROINTESTINAL: No nausea, no vomiting, no abdominal pain ENDOCRINE: No polyuria/polydipsia/polyphagia, no heat/cold intolerance   Exam:  BP 140/80 mmHg  Pulse 75  Wt 175 lb (79.379 kg) Constitutional: VSS, see above. General Appearance: alert, well-developed, well-nourished, NAD Eyes: Normal lids and conjunctive, non-icteric sclera,  Respiratory: Normal respiratory effort. no wheeze, no rhonchi, no rales Cardiovascular: S1/S2 normal, no murmur, no rub/gallop auscultated. RRR.  Psychiatric: Normal judgment/insight. Normal mood and affect.    No results found for this or any previous visit (from the past 72 hour(s)). Recent lab results reviewed: A1c shows improvement despite lack of medication therapy for the past 4 years   ASSESSMENT/PLAN:  Prediabetes - Plan: POCT HgB A1C  Essential hypertension, benign - Plan: lisinopril (PRINIVIL,ZESTRIL) 30 MG tablet, COMPLETE METABOLIC PANEL WITH GFR   No Follow-up on file.

## 2015-06-21 ENCOUNTER — Ambulatory Visit: Payer: Managed Care, Other (non HMO)

## 2015-07-29 ENCOUNTER — Encounter: Payer: Self-pay | Admitting: Physician Assistant

## 2015-07-29 ENCOUNTER — Ambulatory Visit (INDEPENDENT_AMBULATORY_CARE_PROVIDER_SITE_OTHER): Payer: Managed Care, Other (non HMO) | Admitting: Physician Assistant

## 2015-07-29 VITALS — BP 190/67 | HR 69 | Ht 59.0 in | Wt 175.0 lb

## 2015-07-29 DIAGNOSIS — R21 Rash and other nonspecific skin eruption: Secondary | ICD-10-CM

## 2015-07-29 DIAGNOSIS — I10 Essential (primary) hypertension: Secondary | ICD-10-CM

## 2015-07-29 MED ORDER — CLOBETASOL PROPIONATE 0.05 % EX CREA: 1.0000 "application " | TOPICAL_CREAM | Freq: Two times a day (BID) | CUTANEOUS | Status: DC

## 2015-07-29 NOTE — Progress Notes (Addendum)
   Subjective:    Patient ID: Ashley Haynes, female    DOB: [birth year], 60 y.o.   MRN: [id number]  HPI Pt is a 60 year old female that presents with a dry scaly rash on the left breast for 2 weeks. She states it has been extremely itchy. She has tried Eucerin and triamcinolone cream for three days without significant improvement but no worsening of symptoms. She denies pain, nipple discharge, fever and chills. Her last mammogram was 2 years ago.    Review of Systems  All other systems reviewed and are negative.     Objective:   Physical Exam  Constitutional: She is oriented to person, place, and time. She appears well-developed and well-nourished.  HENT:  Head: Normocephalic and atraumatic.  Genitourinary: No breast swelling, tenderness, discharge or bleeding.  Normal right breast.  Left breast: 2 x 3 cm dry, scaly patch without erythema, warmth or ulceration at 3 oclock to 6 oclock over aerola. Non tender.  No masses noted on palpation.   Neurological: She is alert and oriented to person, place, and time.      Assessment & Plan:  Rash- unclear etiology. Seems to be eczema like in origin.  Unlikely to be fungus or infectious. Will try Clobetasol cream. Encouraged to continue Eucerin. Ordered diagnostic mammogram to rule out breast pathology. Follow up in two weeks if rash fails to improve or worsens. We will need to consider biopsy at that point.   HTN- Elevated BP without symptoms today. She did not take her medication this morning. Urged patient not to forget her medications.

## 2015-07-29 NOTE — Patient Instructions (Signed)
Will call with mammogram.

## 2015-08-17 ENCOUNTER — Other Ambulatory Visit: Payer: Self-pay | Admitting: Physician Assistant

## 2015-08-17 DIAGNOSIS — R21 Rash and other nonspecific skin eruption: Secondary | ICD-10-CM

## 2015-08-26 LAB — HM MAMMOGRAPHY

## 2015-09-20 ENCOUNTER — Telehealth: Payer: Self-pay

## 2015-09-20 DIAGNOSIS — Z1239 Encounter for other screening for malignant neoplasm of breast: Secondary | ICD-10-CM

## 2015-09-20 NOTE — Telephone Encounter (Signed)
Patient request referral for mammogram. Ashley Haynes

## 2015-10-11 ENCOUNTER — Ambulatory Visit: Payer: Managed Care, Other (non HMO)

## 2015-10-11 ENCOUNTER — Telehealth: Payer: Self-pay | Admitting: Osteopathic Medicine

## 2015-10-11 ENCOUNTER — Encounter: Payer: Self-pay | Admitting: Osteopathic Medicine

## 2015-10-11 DIAGNOSIS — R928 Other abnormal and inconclusive findings on diagnostic imaging of breast: Secondary | ICD-10-CM

## 2015-10-11 NOTE — Telephone Encounter (Signed)
Called patient and left a message on her vm to call me back regarding a referral for Diagnostic Mammogram because she had was told that something abnormal showed up.  Please advise see note from referral below.Ashley Haynes   MM DIGITAL SCREENING BILATERAL (Order [id number])  Imaging  Date: 09/20/2015 Department: [facility] [place] Ordering/Authorizing: Emeterio Reeve, DO  Order Information   Order Date/Time Release Date/Time Start Date/Time End Date/Time  09/20/15 04:10 PM None 09/20/2015 None  Order Details   Frequency Duration Priority Order Class  None None Routine Ancillary Performed  Appointment Information   Appt Date/Time/Location    at   PreCertification Number (if available)   Order Questions   Question Answer Comment  Pregnant? No   Preferred imaging location? [facility]   Exam reason breast cancer screening   Note:  Enter reason for exam      Comments   10/3: Pt called stating she had mammo @ [facility] & has notice for callback.  Advised to contact md for referall//JTB      Scheduling Instructions   WHEN SCHEDULING PLEASE CALL THE PATIENT TO ADVISE OF APPOINTMENT.      Order History  Outpatient  Date/Time Action Taken User Additional Information  09/20/15 1610 Sign Doree Albee, [place] Ordering Mode: Verbal with Read Back: Cosign Required  09/20/15 1706 Verbal Cosign Emeterio Reeve, DO   Verbal Order Info   Action Created on Order Mode Entered by Responsible Provider Signed by Signed on  Ordering 09/20/15 1610 Verbal with Read Back: Cosign Required Doree Albee, [place], DO 09/20/15 1706  Ordering 10/07/15 1107 Transcribed: No Cosign Required Sandre Kitty  Signature Not Required   Ordering 10/11/15 1036 Transcribed: No Cosign Required Sandre Kitty  Signature Not Required   Associated Diagnoses    ICD-9-CM ICD-10-CM  Breast cancer  screening - Primary    V76.10 Z12.39  Collection Information   Order Provider Info     Office phone Pager E-mail  Ordering User Doree Albee, [place] -- -- --  Authorizing Provider Emeterio Reeve, [place] -- --  Entered By Doree Albee, [place] -- -- --  Ordering Provider Emeterio Reeve, [place] -- --  Signed By (on 09/20/2015 1706) Emeterio Reeve, DO [phone] -- --  Entered By Sandre Kitty -- -- --  Ordering Provider Emeterio Reeve, [place] -- --  Entered By Sandre Kitty -- -- --  Ordering Provider Emeterio Reeve, [place] -- --  Modified By (on 10/07/2015 1107) Sandre Kitty [phone] -- --  Modified By (on 10/11/2015 [place]) Sandre Kitty [phone]

## 2015-10-11 NOTE — Telephone Encounter (Signed)
Pt came in and stated she needs a referral to the  breast center to get a Diagnostic mammogram bc something came back on her normal mammogram. Thanks

## 2015-10-12 NOTE — Telephone Encounter (Signed)
Okay, I placed the referral. The radiologists at the breast center may actually be the doctors who need to see this disc, so I went ahead and left it at the front for her to pick up. I would recommend that she call them to see if this is something that they would need, I can't open it on my computer but the radiologists at the breast center should have the software to look at any images on the disc that they need to see.

## 2015-10-12 NOTE — Telephone Encounter (Signed)
No patient does not have an appt already scheduled but she did drop off disc this morning which is in your basket and she said that she needed the referral in order to make the appointment. Rhonda Cunningham,CMA

## 2015-10-12 NOTE — Telephone Encounter (Signed)
I reviewed the notes, it looks like J has ordered a diagnostic mammogram, can we confirm this with patient that she has an appointment set up?

## 2015-10-12 NOTE — Addendum Note (Signed)
Addended by: Maryla Morrow on: 10/12/2015 05:15 PM   Modules accepted: Orders

## 2015-10-14 NOTE — Telephone Encounter (Signed)
Patient has been informed. Rhonda Cunningham,CMA  

## 2015-12-06 ENCOUNTER — Ambulatory Visit: Payer: Managed Care, Other (non HMO) | Admitting: Osteopathic Medicine

## 2015-12-08 ENCOUNTER — Encounter: Payer: Self-pay | Admitting: Osteopathic Medicine

## 2015-12-08 ENCOUNTER — Ambulatory Visit (INDEPENDENT_AMBULATORY_CARE_PROVIDER_SITE_OTHER): Payer: Managed Care, Other (non HMO) | Admitting: Osteopathic Medicine

## 2015-12-08 VITALS — BP 145/70 | HR 70 | Ht 59.0 in | Wt 165.0 lb

## 2015-12-08 DIAGNOSIS — R7303 Prediabetes: Secondary | ICD-10-CM

## 2015-12-08 DIAGNOSIS — E785 Hyperlipidemia, unspecified: Secondary | ICD-10-CM | POA: Diagnosis not present

## 2015-12-08 DIAGNOSIS — I10 Essential (primary) hypertension: Secondary | ICD-10-CM

## 2015-12-08 LAB — POCT GLYCOSYLATED HEMOGLOBIN (HGB A1C): HEMOGLOBIN A1C: 5.8

## 2015-12-08 NOTE — Patient Instructions (Addendum)
Plan for nurse visit blood pressure recheck and home blood pressure cuff verification in the next few weeks, when you come in to get fasting blood work done to recheck cholesterol and other routine labs. Please let us know if you don't get a call about your blood work results when to 2 business days after you have the blood draw.  If your cuff matches our equipment, as long as your home blood pressures are looking good (ideally 120/80 but anything less than 140/90 is okay) we will need to make any changes to medications. If your home blood pressure cuff does not match our equipment, we will talk about what to do next.

## 2015-12-08 NOTE — Progress Notes (Signed)
HPI: Ashley Haynes is a 60 y.o. female  who presents to [facility] today, 12/08/15,  for chief complaint of:  Chief Complaint  Patient presents with  . Follow-up    blood pressure    Hypertension: Elevated blood pressure today, improved on manual recheck but systolic still high. No chest pain, pressure, shortness of breath. Patient is taking medications as usual. Take home blood pressure readings that we do not have verified cuff on file  Hyperlipidemia: Due for lipid recheck  Prediabetes: Stable as below per A1c    Past medical, surgical, social and family history reviewed: Patient Active Problem List   Diagnosis Date Noted  . Cardiac risk counseling 03/11/2015  . Prediabetes 03/08/2015  . Hyperlipidemia 03/08/2015  . Former smoker 11/30/2014  . Type II or unspecified type diabetes mellitus without mention of complication, not stated as uncontrolled 08/31/2010  . Hyperlipemia 08/31/2010  . Essential hypertension, benign 08/17/2010  . NEVUS, ATYPICAL 04/25/2007  . ANEMIA NOS 04/29/2006  . Symptomatic menopausal or female climacteric states 04/29/2006   Past Surgical History:  Procedure Laterality Date  . ltcs     Social History  Substance Use Topics  . Smoking status: Never Smoker  . Smokeless tobacco: Not on file  . Alcohol use No   Family History  Problem Relation Age of Onset  . Heart disease Mother 35    MI  . Diabetes Mother   . Glaucoma Mother   . Hyperlipidemia Sister      Current medication list and allergy/intolerance information reviewed:   Current Outpatient Prescriptions on File Prior to Visit  Medication Sig Dispense Refill  . atorvastatin (LIPITOR) 40 MG tablet Take 1 tablet (40 mg total) by mouth daily. 90 tablet 3  . clobetasol cream (TEMOVATE) [id number] % Apply 1 application topically 2 (two) times daily. For next 2 weeks. 60 g 0  . glucose blood test strip Use as instructed 100 each 12  . lisinopril  (PRINIVIL,ZESTRIL) 30 MG tablet Take 1 tablet (30 mg total) by mouth daily. 30 tablet 6   No current facility-administered medications on file prior to visit.    No Known Allergies    Review of Systems:  Constitutional: No recent illness  HEENT: No  headache, no vision change  Cardiac: No  chest pain, No  pressure, No palpitations  Respiratory:  No  shortness of breath. No  Cough  Neurologic: No  weakness  Exam:  BP (!) 145/70   Pulse 70   Ht 4\' 11"  (1.499 m)   Wt 165 lb (74.8 kg)   BMI 33.33 kg/m   Constitutional: VS see above. General Appearance: alert, well-developed, well-nourished, NAD  Eyes: Normal lids and conjunctive, non-icteric sclera  Ears, Nose, Mouth, Throat: MMM, Normal external inspection ears/nares/mouth/lips/gums.  Neck: No masses, trachea midline.   Respiratory: Normal respiratory effort. no wheeze, no rhonchi, no rales  Cardiovascular: S1/S2 normal, no murmur, no rub/gallop auscultated. RRR.   Musculoskeletal: Gait normal. Symmetric and independent movement of all extremities  Neurological: Normal balance/coordination. No tremor.  Skin: warm, dry, intact.   Psychiatric: Normal judgment/insight. Normal mood and affect. Oriented x3.   Results for orders placed or performed in visit on 12/08/15 (from the past 24 hour(s))  POCT HgB A1C     Status: None   Collection Time: 12/08/15  8:26 AM  Result Value Ref Range   Hemoglobin A1C 5.8      ASSESSMENT/PLAN:   Prediabetes - A1c under good control.  Diet discussed.  - Plan: POCT HgB A1C, CBC with Differential/Platelet, Lipid panel, COMPLETE METABOLIC PANEL WITH GFR  Hyperlipidemia, unspecified hyperlipidemia type - Plan for lipid recheck - Plan: Lipid panel  Essential hypertension - Plan for nurse visit recheck blood pressure when she comes back for labs, bring home blood pressure cuff for verification - Plan: CBC with Differential/Platelet, Lipid panel, COMPLETE METABOLIC PANEL WITH GFR     Patient Instructions  Plan for nurse visit blood pressure recheck and home blood pressure cuff verification in the next few weeks, when you come in to get fasting blood work done to recheck cholesterol and other routine labs. Please let us know if you don't get a call about your blood work results when to 2 business days after you have the blood draw.  If your cuff matches our equipment, as long as your home blood pressures are looking good (ideally 120/80 but anything less than 140/90 is okay) we will need to make any changes to medications. If your home blood pressure cuff does not match our equipment, we will talk about what to do next.    Visit summary with medication list and pertinent instructions was printed for patient to review. All questions at time of visit were answered - patient instructed to contact office with any additional concerns. ER/RTC precautions were reviewed with the patient. Follow-up plan: Return in about 2 weeks (around 12/22/2015) for labd and nurse visit BP cuff verification, sooner if needed, and 3-6 months for ANNUAL PHYSICAL.

## 2016-04-24 ENCOUNTER — Encounter: Payer: Self-pay | Admitting: Gastroenterology

## 2016-04-25 ENCOUNTER — Other Ambulatory Visit: Payer: Self-pay | Admitting: Osteopathic Medicine

## 2016-04-25 DIAGNOSIS — E785 Hyperlipidemia, unspecified: Secondary | ICD-10-CM

## 2017-12-27 ENCOUNTER — Encounter: Payer: Self-pay | Admitting: Physician Assistant

## 2017-12-27 ENCOUNTER — Ambulatory Visit (INDEPENDENT_AMBULATORY_CARE_PROVIDER_SITE_OTHER): Payer: Commercial Managed Care - PPO | Admitting: Physician Assistant

## 2017-12-27 VITALS — BP 158/72 | HR 77 | Temp 98.3°F | Resp 14 | Wt 161.0 lb

## 2017-12-27 DIAGNOSIS — J0110 Acute frontal sinusitis, unspecified: Secondary | ICD-10-CM

## 2017-12-27 MED ORDER — AMOXICILLIN-POT CLAVULANATE 875-125 MG PO TABS: 1.0000 | ORAL_TABLET | Freq: Two times a day (BID) | ORAL | 0 refills | Status: AC

## 2017-12-27 NOTE — Progress Notes (Signed)
HPI:                                                                Ashley Haynes is a 62 y.o. female who presents to [facility]: [facility] today for URI symptoms  URI   This is a new problem. The current episode started in the past 7 days. The problem has been unchanged. There has been no fever. Associated symptoms include congestion, coughing (occasionally productive), headaches, sinus pain and a sore throat (PND). Pertinent negatives include no chest pain, ear pain, rash or vomiting. Treatments tried: nasal saline, mucinex. The treatment provided no relief.     Past Medical History:  Diagnosis Date  . Cardiac risk counseling 03/11/2015   Ten-year ASCVD risk 15.1% (calculated 03/11/2015) given hyperlipidemia, moderately well-controlled blood pressure, age. Prediabetes is also a concern, as well as increased BMI  . Former smoker 11/30/2014   Quit 09/1988  . Menopause age 16   Past Surgical History:  Procedure Laterality Date  . ltcs     Social History   Tobacco Use  . Smoking status: Never Smoker  Substance Use Topics  . Alcohol use: No   family history includes Diabetes in her mother; Glaucoma in her mother; Heart disease (age of onset: 36) in her mother; Hyperlipidemia in her sister.    ROS: negative except as noted in the HPI  Medications: Current Outpatient Medications  Medication Sig Dispense Refill  . atorvastatin (LIPITOR) 40 MG tablet TAKE 1 TABLET (40 MG TOTAL) BY MOUTH DAILY. 90 tablet 1  . clobetasol cream (TEMOVATE) 0.63 % Apply 1 application topically 2 (two) times daily. For next 2 weeks. 60 g 0  . glucose blood test strip Use as instructed 100 each 12  . lisinopril (PRINIVIL,ZESTRIL) 30 MG tablet Take 1 tablet (30 mg total) by mouth daily. 30 tablet 6   No current facility-administered medications for this visit.    No Known Allergies     Objective:  BP (!) 179/92   Pulse 77   Temp 98.3 F (36.8 C) (Oral)    Wt 161 lb (73 kg)   BMI 32.52 kg/m  Gen:  alert, not ill-appearing, no distress, appropriate for age 8: head normocephalic without obvious abnormality, conjunctiva and cornea clear, tympanic membranes pearly gray and semitransparent bilaterally, external ear canals normal bilaterally, nasal mucosa edematous, there is bilateral frontal sinus tenderness, oropharynx clear, no exudates, uvula midline, neck supple, no cervical adenopathy trachea midline Pulm: Normal work of breathing, normal phonation, clear to auscultation bilaterally, no wheezes, rales or rhonchi CV: Normal rate, regular rhythm, s1 and s2 distinct, no murmurs, clicks or rubs  Neuro: alert and oriented x 3, no tremor MSK: extremities atraumatic, normal gait and station Skin: intact, no rashes on exposed skin, no jaundice, no cyanosis    No results found for this or any previous visit (from the past 72 hour(s)). No results found.    Assessment and Plan: 62 y.o. female with   .Ashley Haynes was seen today for uri.  Diagnoses and all orders for this visit:  Acute non-recurrent frontal sinusitis -     amoxicillin-clavulanate (AUGMENTIN) 875-125 MG tablet; Take 1 tablet by mouth 2 (two) times daily for 7 days.    Afebrile,  no tachypnea, no tachycardia, pulse ox 100% on room air at rest, no adventitious lung sounds Treating empirically for bacterial sinusitis given persistent symptoms for over 1 week Continue nasal toileting Counseled on supportive care  Patient education and anticipatory guidance given Patient agrees with treatment plan Follow-up as needed if symptoms worsen or fail to improve  Darlyne Russian PA-C

## 2017-12-27 NOTE — Patient Instructions (Addendum)
For nasal symptoms/sinusitis: - nasal saline rinses / netti pot several times per day (do this prior to nasal spray) - you can use OTC nasal decongestant sprays like Afrin (oxymetazoline) BUT do not use for more than 3 days as it will cause worsening congestion/nasal symptoms) - warm facial compresses - oral decongestants and antihistamines like Claritin-D and Zyrtec-D may help dry up secretions (caution using decongestants if you have high blood pressure, heart disease or kidney disease) - for sinus headache: Tylenol 1000mg  every 8 hours as needed. Alternate with Ibuprofen 600mg  every 6 hours  For sore throat: - Tylenol 1000mg  every 8 hours as needed for throat pain. Alternate with Ibuprofen 600mg  every 6 hours - Cepacol throat lozenges and/or Chloraseptic spray - Warm salt water gargles  For cough: - Cough is a protective mechanism and an important part of fighting an infection. I encourage you to avoid suppressing your cough with medication during the day if possible.  - Mucinex with at least 8 oz. of water can loosen chest congestion and make cough more productive, which means you will actually cough less - Okay to use a cough suppressant at bedtime in order to rest (Nyquil, Delsym, Robitussin, etc.)  Note: follow package instructions for all over-the-counter medications. If using multi-symptom medications (Dayquil, Theraflu, etc.), check the label for duplicate drug ingredients.   Sinusitis, Adult Sinusitis is inflammation of your sinuses. Sinuses are hollow spaces in the bones around your face. Your sinuses are located:  Around your eyes.  In the middle of your forehead.  Behind your nose.  In your cheekbones. Mucus normally drains out of your sinuses. When your nasal tissues become inflamed or swollen, mucus can become trapped or blocked. This allows bacteria, viruses, and fungi to grow, which leads to infection. Most infections of the sinuses are caused by a virus. Sinusitis  can develop quickly. It can last for up to 4 weeks (acute) or for more than 12 weeks (chronic). Sinusitis often develops after a cold. What are the causes? This condition is caused by anything that creates swelling in the sinuses or stops mucus from draining. This includes:  Allergies.  Asthma.  Infection from bacteria or viruses.  Deformities or blockages in your nose or sinuses.  Abnormal growths in the nose (nasal polyps).  Pollutants, such as chemicals or irritants in the air.  Infection from fungi (rare). What increases the risk? You are more likely to develop this condition if you:  Have a weak body defense system (immune system).  Do a lot of swimming or diving.  Overuse nasal sprays.  Smoke. What are the signs or symptoms? The main symptoms of this condition are pain and a feeling of pressure around the affected sinuses. Other symptoms include:  Stuffy nose or congestion.  Thick drainage from your nose.  Swelling and warmth over the affected sinuses.  Headache.  Upper toothache.  A cough that may get worse at night.  Extra mucus that collects in the throat or the back of the nose (postnasal drip).  Decreased sense of smell and taste.  Fatigue.  A fever.  Sore throat.  Bad breath. How is this diagnosed? This condition is diagnosed based on:  Your symptoms.  Your medical history.  A physical exam.  Tests to find out if your condition is acute or chronic. This may include: ? Checking your nose for nasal polyps. ? Viewing your sinuses using a device that has a light (endoscope). ? Testing for allergies or bacteria. ? Imaging  tests, such as an MRI or CT scan. In rare cases, a bone biopsy may be done to rule out more serious types of fungal sinus disease. How is this treated? Treatment for sinusitis depends on the cause and whether your condition is chronic or acute.  If caused by a virus, your symptoms should go away on their own within 10  days. You may be given medicines to relieve symptoms. They include: ? Medicines that shrink swollen nasal passages (topical intranasal decongestants). ? Medicines that treat allergies (antihistamines). ? A spray that eases inflammation of the nostrils (topical intranasal corticosteroids). ? Rinses that help get rid of thick mucus in your nose (nasal saline washes).  If caused by bacteria, your health care provider may recommend waiting to see if your symptoms improve. Most bacterial infections will get better without antibiotic medicine. You may be given antibiotics if you have: ? A severe infection. ? A weak immune system.  If caused by narrow nasal passages or nasal polyps, you may need to have surgery. Follow these instructions at home: Medicines  Take, use, or apply over-the-counter and prescription medicines only as told by your health care provider. These may include nasal sprays.  If you were prescribed an antibiotic medicine, take it as told by your health care provider. Do not stop taking the antibiotic even if you start to feel better. Hydrate and humidify   Drink enough fluid to keep your urine pale yellow. Staying hydrated will help to thin your mucus.  Use a cool mist humidifier to keep the humidity level in your home above 50%.  Inhale steam for 10-15 minutes, 3-4 times a day, or as told by your health care provider. You can do this in the bathroom while a hot shower is running.  Limit your exposure to cool or dry air. Rest  Rest as much as possible.  Sleep with your head raised (elevated).  Make sure you get enough sleep each night. General instructions   Apply a warm, moist washcloth to your face 3-4 times a day or as told by your health care provider. This will help with discomfort.  Wash your hands often with soap and water to reduce your exposure to germs. If soap and water are not available, use hand sanitizer.  Do not smoke. Avoid being around people who  are smoking (secondhand smoke).  Keep all follow-up visits as told by your health care provider. This is important. Contact a health care provider if:  You have a fever.  Your symptoms get worse.  Your symptoms do not improve within 10 days. Get help right away if:  You have a severe headache.  You have persistent vomiting.  You have severe pain or swelling around your face or eyes.  You have vision problems.  You develop confusion.  Your neck is stiff.  You have trouble breathing. Summary  Sinusitis is soreness and inflammation of your sinuses. Sinuses are hollow spaces in the bones around your face.  This condition is caused by nasal tissues that become inflamed or swollen. The swelling traps or blocks the flow of mucus. This allows bacteria, viruses, and fungi to grow, which leads to infection.  If you were prescribed an antibiotic medicine, take it as told by your health care provider. Do not stop taking the antibiotic even if you start to feel better.  Keep all follow-up visits as told by your health care provider. This is important. This information is not intended to replace advice given to  you by your health care provider. Make sure you discuss any questions you have with your health care provider. Document Released: 12/25/2004 Document Revised: 05/27/2017 Document Reviewed: 05/27/2017 Elsevier Interactive Patient Education  2019 [employer].

## 2018-04-08 ENCOUNTER — Encounter: Payer: Commercial Managed Care - PPO | Admitting: Osteopathic Medicine

## 2018-05-08 ENCOUNTER — Encounter: Payer: Commercial Managed Care - PPO | Admitting: Osteopathic Medicine

## 2018-05-19 ENCOUNTER — Encounter: Payer: Commercial Managed Care - PPO | Admitting: Osteopathic Medicine

## 2018-05-27 ENCOUNTER — Encounter: Payer: Self-pay | Admitting: Osteopathic Medicine

## 2018-05-27 ENCOUNTER — Ambulatory Visit (INDEPENDENT_AMBULATORY_CARE_PROVIDER_SITE_OTHER): Payer: Commercial Managed Care - PPO | Admitting: Osteopathic Medicine

## 2018-05-27 VITALS — BP 162/72 | HR 75 | Temp 98.0°F | Wt 174.2 lb

## 2018-05-27 DIAGNOSIS — Z1239 Encounter for other screening for malignant neoplasm of breast: Secondary | ICD-10-CM

## 2018-05-27 DIAGNOSIS — I10 Essential (primary) hypertension: Secondary | ICD-10-CM

## 2018-05-27 DIAGNOSIS — Z23 Encounter for immunization: Secondary | ICD-10-CM

## 2018-05-27 DIAGNOSIS — Z1211 Encounter for screening for malignant neoplasm of colon: Secondary | ICD-10-CM

## 2018-05-27 DIAGNOSIS — Z0000 Encounter for general adult medical examination without abnormal findings: Secondary | ICD-10-CM | POA: Diagnosis not present

## 2018-05-27 DIAGNOSIS — E782 Mixed hyperlipidemia: Secondary | ICD-10-CM

## 2018-05-27 DIAGNOSIS — Z87891 Personal history of nicotine dependence: Secondary | ICD-10-CM

## 2018-05-27 DIAGNOSIS — R7303 Prediabetes: Secondary | ICD-10-CM

## 2018-05-27 MED ORDER — LISINOPRIL 30 MG PO TABS: 30.0000 mg | ORAL_TABLET | Freq: Every day | ORAL | 0 refills | Status: DC

## 2018-05-27 NOTE — Progress Notes (Signed)
HPI: Ashley Haynes is a 63 y.o. female who  has a past medical history of Cardiac risk counseling (03/11/2015), Former smoker (11/30/2014), and Menopause (age 58).  she presents to [facility] today, 05/27/18,  for chief complaint of: Physical   Hasn't been seen by me since 11/2015 Reports doing well   HTN:  BP Readings from Last 3 Encounters:  05/27/18 (!) 162/72  12/27/17 (!) 158/72  12/08/15 (!) 145/70   Was advised last visit to come in for BP recheck / home monitor verification but did not follow up. Previously on lisinopril 30 mg.        Past medical, surgical, social and family history reviewed:  Patient Active Problem List   Diagnosis Date Noted  . Cardiac risk counseling 03/11/2015  . Prediabetes 03/08/2015  . Hyperlipidemia 03/08/2015  . Former smoker 11/30/2014  . Essential hypertension, benign 08/17/2010  . NEVUS, ATYPICAL 04/25/2007  . ANEMIA NOS 04/29/2006  . Symptomatic menopausal or female climacteric states 04/29/2006    Past Surgical History:  Procedure Laterality Date  . ltcs      Social History   Tobacco Use  . Smoking status: Never Smoker  . Smokeless tobacco: Never Used  Substance Use Topics  . Alcohol use: No    Family History  Problem Relation Age of Onset  . Heart disease Mother 67       MI  . Diabetes Mother   . Glaucoma Mother   . Hyperlipidemia Sister      Current medication list and allergy/intolerance information reviewed:    Current Outpatient Medications  Medication Sig Dispense Refill  . atorvastatin (LIPITOR) 40 MG tablet TAKE 1 TABLET (40 MG TOTAL) BY MOUTH DAILY. 90 tablet 1  . clobetasol cream (TEMOVATE) 5.80 % Apply 1 application topically 2 (two) times daily. For next 2 weeks. 60 g 0  . glucose blood test strip Use as instructed 100 each 12  . lisinopril (ZESTRIL) 30 MG tablet Take 1 tablet (30 mg total) by mouth daily. 90 tablet 0   No current facility-administered  medications for this visit.     No Known Allergies    Review of Systems:  Constitutional:  No  fever, no chills, No recent illness, No unintentional weight changes. No significant fatigue.   HEENT: No  headache, no vision change, no hearing change, No sore throat, No  sinus pressure, +stye on L eye recnetly improved w/ warm compresses   Cardiac: No  chest pain, No  pressure, No palpitations, No  Orthopnea  Respiratory:  No  shortness of breath. No  Cough  Gastrointestinal: No  abdominal pain, No  nausea, No  vomiting,  No  blood in stool, No  diarrhea, No  constipation   Musculoskeletal: No new myalgia/arthralgia  Skin: No  Rash, No other wounds/concerning lesions  Genitourinary: No  incontinence, No  abnormal genital bleeding, No abnormal genital discharge  Hem/Onc: No  easy bruising/bleeding, No  abnormal lymph node  Endocrine: No cold intolerance,  No heat intolerance. No polyuria/polydipsia/polyphagia   Neurologic: No  weakness, No  dizziness, No  slurred speech/focal weakness/facial droop  Psychiatric: No  concerns with depression, No  concerns with anxiety, No sleep problems, No mood problems, +memory issues  Exam:  BP (!) 162/72 (BP Location: Left Arm, Patient Position: Sitting, Cuff Size: Normal)   Pulse 75   Temp 98 F (36.7 C) (Oral)   Wt 174 lb 3.2 oz (79 kg)   BMI 35.18 kg/m  Constitutional: VS see above. General Appearance: alert, well-developed, well-nourished, NAD  Eyes: Normal lids and conjunctive, non-icteric sclera  Ears, Nose, Mouth, Throat: MMM, Normal external inspection ears/nares/mouth/lips/gums. TM normal bilaterally. Pharynx/tonsils no erythema, no exudate. Nasal mucosa normal.   Neck: No masses, trachea midline. No thyroid enlargement. No tenderness/mass appreciated. No lymphadenopathy  Respiratory: Normal respiratory effort. no wheeze, no rhonchi, no rales  Cardiovascular: S1/S2 normal, no murmur, no rub/gallop auscultated. RRR. No lower  extremity edema.   Gastrointestinal: Nontender, no masses. No hepatomegaly, no splenomegaly. No hernia appreciated. Bowel sounds normal. Rectal exam deferred.   Musculoskeletal: Gait normal. No clubbing/cyanosis of digits.   Neurological: Normal balance/coordination. No tremor. No cranial nerve deficit on limited exam. Motor and sensation intact and symmetric. Cerebellar reflexes intact.   Skin: warm, dry, intact. No rash/ulcer. No concerning nevi or subq nodules on limited exam.    Psychiatric: Normal judgment/insight. Normal mood and affect. Oriented x3.    No results found for this or any previous visit (from the past 72 hour(s)).  No results found.   ASSESSMENT/PLAN: The primary encounter diagnosis was Annual physical exam. Diagnoses of Need for Tdap vaccination, Essential hypertension, benign, Mixed hyperlipidemia, Prediabetes, Former smoker, Screen for colon cancer, Screening for breast cancer, and Essential hypertension, benign were also pertinent to this visit.   Orders Placed This Encounter  Procedures  . MM 3D SCREEN BREAST BILATERAL  . Tdap vaccine greater than or equal to 7yo IM  . CBC  . COMPLETE METABOLIC PANEL WITH GFR  . Lipid panel  . TSH  . Hemoglobin A1c  . VITAMIN D 25 Hydroxy (Vit-D Deficiency, Fractures)  . Ambulatory referral to Gastroenterology    Meds ordered this encounter  Medications  . lisinopril (ZESTRIL) 30 MG tablet    Sig: Take 1 tablet (30 mg total) by mouth daily.    Dispense:  90 tablet    Refill:  0    Patient Instructions  General Preventive Care  Most recent routine screening lipids/other labs: ordered today   Tobacco: don't!   Alcohol: responsible moderation is ok for most adults - if you have concerns about your alcohol intake, please talk to me!   Exercise: as tolerated to reduce risk of cardiovascular disease and diabetes. Strength training will also prevent osteoporosis.   Mental health: if need for mental health care  (medicines, counseling, other), or concerns about moods, please let me know!   Sexual health: if need for STD testing, or if concerns with libido/pain problems, please let me know! If you need to discuss your birth control options, please let me know!   Advanced Directive: Living Will and/or Healthcare Power of Attorney recommended for all adults, regardless of age or health.  Vaccines  Flu vaccine: recommended for almost everyone, every fall.   Shingles vaccine: Shingrix recommended after age 65. Will put you on clinic waiting list   Pneumonia vaccines: Prevnar and Pneumovax recommended after age 73, or sooner if certain medical conditions.  Tetanus booster: Tdap recommended every 10 years.  Cancer screenings   Colon cancer screening: will refer for colonoscopy   Breast cancer screening: mammogram recommended annually after age 67.   Cervical cancer screening: Pap due 2021  Lung cancer screening: not needed if quit >15 years ago  Infection screenings . HIV: recommended screening at least once age 23-65, more often as needed. . Gonorrhea/Chlamydia: screening as needed . Hepatitis C: recommended for anyone born 86-1966 . TB: certain at-risk populations, or depending on work requirements and/or  travel history Other . Bone Density Test: recommended for women at age 70            Visit summary with medication list and pertinent instructions was printed for patient to review. All questions at time of visit were answered - patient instructed to contact office with any additional concerns or updates. ER/RTC precautions were reviewed with the patient.    Please note: voice recognition software was used to produce this document, and typos may escape review. Please contact Dr. Sheppard Coil for any needed clarifications.     Follow-up plan: Return in about 1 week (around 06/03/2018) for recheck blood pressure, review lab results .

## 2018-05-27 NOTE — Patient Instructions (Addendum)
General Preventive Care  Most recent routine screening lipids/other labs: ordered today   Tobacco: don't!   Alcohol: responsible moderation is ok for most adults - if you have concerns about your alcohol intake, please talk to me!   Exercise: as tolerated to reduce risk of cardiovascular disease and diabetes. Strength training will also prevent osteoporosis.   Mental health: if need for mental health care (medicines, counseling, other), or concerns about moods, please let me know!   Sexual health: if need for STD testing, or if concerns with libido/pain problems, please let me know! If you need to discuss your birth control options, please let me know!   Advanced Directive: Living Will and/or Healthcare Power of Attorney recommended for all adults, regardless of age or health.  Vaccines  Flu vaccine: recommended for almost everyone, every fall.   Shingles vaccine: Shingrix recommended after age 27. Will put you on clinic waiting list   Pneumonia vaccines: Prevnar and Pneumovax recommended after age 77, or sooner if certain medical conditions.  Tetanus booster: Tdap recommended every 10 years.  Cancer screenings   Colon cancer screening: will refer for colonoscopy   Breast cancer screening: mammogram recommended annually after age 2.   Cervical cancer screening: Pap due 2021  Lung cancer screening: not needed if quit >15 years ago  Infection screenings . HIV: recommended screening at least once age 97-65, more often as needed. . Gonorrhea/Chlamydia: screening as needed . Hepatitis C: recommended for anyone born 02-1964 . TB: certain at-risk populations, or depending on work requirements and/or travel history Other . Bone Density Test: recommended for women at age 40

## 2018-06-03 ENCOUNTER — Ambulatory Visit: Payer: Commercial Managed Care - PPO | Admitting: Osteopathic Medicine

## 2018-06-04 ENCOUNTER — Telehealth: Payer: Self-pay | Admitting: Osteopathic Medicine

## 2018-06-04 DIAGNOSIS — Z87898 Personal history of other specified conditions: Secondary | ICD-10-CM

## 2018-06-04 LAB — COMPLETE METABOLIC PANEL WITH GFR
AG Ratio: 1.2 (calc) (ref 1.0–2.5)
ALT: 22 U/L (ref 6–29)
AST: 26 U/L (ref 10–35)
Albumin: 4.3 g/dL (ref 3.6–5.1)
Alkaline phosphatase (APISO): 63 U/L (ref 37–153)
BUN: 17 mg/dL (ref 7–25)
CO2: 28 mmol/L (ref 20–32)
Calcium: 9.9 mg/dL (ref 8.6–10.4)
Chloride: 103 mmol/L (ref 98–110)
Creat: 0.85 mg/dL (ref 0.50–0.99)
GFR, Est African American: 85 mL/min/{1.73_m2} (ref >=60)
GFR, Est Non African American: 73 mL/min/{1.73_m2} (ref >=60)
Globulin: 3.5 g/dL (calc) (ref 1.9–3.7)
Glucose, Bld: 95 mg/dL (ref 65–99)
Potassium: 4.4 mmol/L (ref 3.5–5.3)
Sodium: 137 mmol/L (ref 135–146)
Total Bilirubin: 0.3 mg/dL (ref 0.2–1.2)
Total Protein: 7.8 g/dL (ref 6.1–8.1)

## 2018-06-04 LAB — CBC
HCT: 42.4 % (ref 35.0–45.0)
Hemoglobin: 13.8 g/dL (ref 11.7–15.5)
MCH: 27.3 pg (ref 27.0–33.0)
MCHC: 32.5 g/dL (ref 32.0–36.0)
MCV: 84 fL (ref 80.0–100.0)
MPV: 9.7 fL (ref 7.5–12.5)
Platelets: 242 10*3/uL (ref 140–400)
RBC: 5.05 10*6/uL (ref 3.80–5.10)
RDW: 13.8 % (ref 11.0–15.0)
WBC: 5.2 10*3/uL (ref 3.8–10.8)

## 2018-06-04 LAB — HEMOGLOBIN A1C
Hgb A1c MFr Bld: 5.8 % of total Hgb — ABNORMAL HIGH (ref <5.7)
Mean Plasma Glucose: 120 (calc)
eAG (mmol/L): 6.6 (calc)

## 2018-06-04 LAB — LIPID PANEL
Cholesterol: 294 mg/dL — ABNORMAL HIGH (ref <200)
HDL: 61 mg/dL (ref >=50)
LDL Cholesterol (Calc): 212 mg/dL (calc) — ABNORMAL HIGH
Non-HDL Cholesterol (Calc): 233 mg/dL (calc) — ABNORMAL HIGH (ref <130)
Total CHOL/HDL Ratio: 4.8 (calc) (ref <5.0)
Triglycerides: 94 mg/dL (ref <150)

## 2018-06-04 LAB — VITAMIN D 25 HYDROXY (VIT D DEFICIENCY, FRACTURES): Vit D, 25-Hydroxy: 26 ng/mL — ABNORMAL LOW (ref 30–100)

## 2018-06-04 LAB — TSH: TSH: 1.76 mIU/L (ref 0.40–4.50)

## 2018-06-04 NOTE — Telephone Encounter (Signed)
Order is in. Thanks.  

## 2018-06-04 NOTE — Telephone Encounter (Signed)
-----   Message from Antonietta Barcelona sent at 06/04/2018  8:17 AM EDT ----- Regarding: Mammo Good Morning.  The above pt is in need of a Diagnostic f/u mammo per her 2017 report from [facility]. I can't find anywhere where she returned to do this. She will need to do this prior to being able to return to a screening mammo. Sorry for any trouble, if I can help or be of more assistance please dont hesitate.  Thanks,  Junie Panning

## 2018-06-05 ENCOUNTER — Other Ambulatory Visit: Payer: Self-pay

## 2018-06-05 ENCOUNTER — Ambulatory Visit: Payer: Commercial Managed Care - PPO | Admitting: Osteopathic Medicine

## 2018-06-05 ENCOUNTER — Encounter: Payer: Self-pay | Admitting: Osteopathic Medicine

## 2018-06-05 ENCOUNTER — Ambulatory Visit (INDEPENDENT_AMBULATORY_CARE_PROVIDER_SITE_OTHER): Payer: Commercial Managed Care - PPO | Admitting: Osteopathic Medicine

## 2018-06-05 VITALS — BP 153/69 | HR 75 | Temp 98.3°F | Wt 174.0 lb

## 2018-06-05 DIAGNOSIS — R7303 Prediabetes: Secondary | ICD-10-CM | POA: Diagnosis not present

## 2018-06-05 DIAGNOSIS — I10 Essential (primary) hypertension: Secondary | ICD-10-CM | POA: Diagnosis not present

## 2018-06-05 DIAGNOSIS — E785 Hyperlipidemia, unspecified: Secondary | ICD-10-CM | POA: Diagnosis not present

## 2018-06-05 MED ORDER — LISINOPRIL 30 MG PO TABS: 30.0000 mg | ORAL_TABLET | Freq: Every day | ORAL | 1 refills | Status: DC

## 2018-06-05 MED ORDER — ATORVASTATIN CALCIUM 40 MG PO TABS: 40.0000 mg | ORAL_TABLET | Freq: Every day | ORAL | 1 refills | Status: DC

## 2018-06-05 NOTE — Progress Notes (Signed)
HPI: Ashley Haynes is a 63 y.o. female who  has a past medical history of Cardiac risk counseling (03/11/2015), Former smoker (11/30/2014), and Menopause (age 74).  she presents to [facility] today, 06/05/18,  for chief complaint of:  BP recheck Review labs   Labs as below reviewed in detail HTN: Hasn't started BP meds yet no CP/SOB, no HA/VC  HLD: not following any particular diet or exercise plan  PreDM: mom had diabetes, pt is concerned about progression     At today's visit 06/05/18 ... PMH, PSH, FH reviewed and updated as needed.  Current medication list and allergy/intolerance hx reviewed and updated as needed. (See remainder of HPI, ROS, Phys Exam below)   No results found.  Results for orders placed or performed in visit on 05/27/18 (from the past 72 hour(s))  CBC     Status: None   Collection Time: 06/03/18  8:53 AM  Result Value Ref Range   WBC 5.2 3.8 - 10.8 Thousand/uL   RBC 5.05 3.80 - 5.10 Million/uL   Hemoglobin 13.8 11.7 - 15.5 g/dL   HCT 42.4 35.0 - 45.0 %   MCV 84.0 80.0 - 100.0 fL   MCH 27.3 27.0 - 33.0 pg   MCHC 32.5 32.0 - 36.0 g/dL   RDW 13.8 11.0 - 15.0 %   Platelets 242 140 - 400 Thousand/uL   MPV 9.7 7.5 - 12.5 fL  COMPLETE METABOLIC PANEL WITH GFR     Status: None   Collection Time: 06/03/18  8:53 AM  Result Value Ref Range   Glucose, Bld 95 65 - 99 mg/dL    Comment: .            Fasting reference interval .    BUN 17 7 - 25 mg/dL   Creat 0.85 0.50 - 0.99 mg/dL    Comment: For patients >77 years of age, the reference limit for Creatinine is approximately 13% higher for people identified as African-American. .    GFR, Est Non African American 73 > OR = 60 mL/min/1.10m2   GFR, Est African American 85 > OR = 60 mL/min/1.49m2   BUN/Creatinine Ratio NOT APPLICABLE 6 - 22 (calc)   Sodium 137 135 - 146 mmol/L   Potassium 4.4 3.5 - 5.3 mmol/L   Chloride 103 98 - 110 mmol/L   CO2 28 20 - 32 mmol/L    Calcium 9.9 8.6 - 10.4 mg/dL   Total Protein 7.8 6.1 - 8.1 g/dL   Albumin 4.3 3.6 - 5.1 g/dL   Globulin 3.5 1.9 - 3.7 g/dL (calc)   AG Ratio 1.2 1.0 - 2.5 (calc)   Total Bilirubin 0.3 0.2 - 1.2 mg/dL   Alkaline phosphatase (APISO) 63 37 - 153 U/L   AST 26 10 - 35 U/L   ALT 22 6 - 29 U/L  Lipid panel     Status: Abnormal   Collection Time: 06/03/18  8:53 AM  Result Value Ref Range   Cholesterol 294 (H) <200 mg/dL   HDL 61 > OR = 50 mg/dL   Triglycerides 94 <150 mg/dL   LDL Cholesterol (Calc) 212 (H) mg/dL (calc)    Comment: LDL-C levels > or = 190 mg/dL may indicate familial  hypercholesterolemia (FH). Clinical assessment and  measurement of blood lipid levels should be  considered for all first degree relatives of  patients with an FH diagnosis.  For questions about testing for familial hypercholesterolemia, please call Insurance risk surveyor at  1.866.GENE.INFO. Duncan Dull, et al. J National Lipid Association  Recommendations for Patient-Centered Management of  Dyslipidemia: Part 1 Journal of Clinical Lipidology  2015;9(2), 129-169. Reference range: <100 . Desirable range <100 mg/dL for primary prevention;   <70 mg/dL for patients with CHD or diabetic patients  with > or = 2 CHD risk factors. Marland Kitchen LDL-C is now calculated using the Martin-Hopkins  calculation, which is a validated novel method providing  better accuracy than the Friedewald equation in the  estimation of LDL-C.  Cresenciano Genre et al. Annamaria Helling. 9169;450(38): 2061-2068  (http://education.[facility].com/faq/FAQ164)    Total CHOL/HDL Ratio 4.8 <5.0 (calc)   Non-HDL Cholesterol (Calc) 233 (H) <130 mg/dL (calc)    Comment: Non-HDL level > or = 220 is very high and may indicate  genetic familial hypercholesterolemia (FH). Clinical  assessment and measurement of blood lipid levels  should be considered for all first-degree relatives  of patients with an FH diagnosis. . For patients with diabetes plus 1 major  ASCVD risk  factor, treating to a non-HDL-C goal of <100 mg/dL  (LDL-C of <70 mg/dL) is considered a therapeutic  option.   TSH     Status: None   Collection Time: 06/03/18  8:53 AM  Result Value Ref Range   TSH 1.76 0.40 - 4.50 mIU/L  Hemoglobin A1c     Status: Abnormal   Collection Time: 06/03/18  8:53 AM  Result Value Ref Range   Hgb A1c MFr Bld 5.8 (H) <5.7 % of total Hgb    Comment: For someone without known diabetes, a hemoglobin  A1c value between 5.7% and 6.4% is consistent with prediabetes and should be confirmed with a  follow-up test. . For someone with known diabetes, a value <7% indicates that their diabetes is well controlled. A1c targets should be individualized based on duration of diabetes, age, comorbid conditions, and other considerations. . This assay result is consistent with an increased risk of diabetes. . Currently, no consensus exists regarding use of hemoglobin A1c for diagnosis of diabetes for children. .    Mean Plasma Glucose 120 (calc)   eAG (mmol/L) 6.6 (calc)  VITAMIN D 25 Hydroxy (Vit-D Deficiency, Fractures)     Status: Abnormal   Collection Time: 06/03/18  8:53 AM  Result Value Ref Range   Vit D, 25-Hydroxy 26 (L) 30 - 100 ng/mL    Comment: Vitamin D Status         25-OH Vitamin D: . Deficiency:                    <20 ng/mL Insufficiency:             20 - 29 ng/mL Optimal:                 > or = 30 ng/mL . For 25-OH Vitamin D testing on patients on  D2-supplementation and patients for whom quantitation  of D2 and D3 fractions is required, the [facility]ureD(TM) 25-OH VIT D, (D2,D3), LC/MS/MS is recommended: order  code [phone] (patients >54yrs). See Note 1 . Note 1 . For additional information, please refer to  http://education.[facility].com/faq/FAQ199  (This link is being provided for informational/ educational purposes only.)           ASSESSMENT/PLAN: The primary encounter diagnosis was Essential hypertension,  benign. Diagnoses of Hyperlipidemia and Prediabetes were also pertinent to this visit.   Restart lisinopril and lipitor   Pt would like to hold off on metformin for now  Info printed on  diet/exercise for preDM and HLD   Orders Placed This Encounter  Procedures  . Hemoglobin A1c  . Lipid panel  . COMPLETE METABOLIC PANEL WITH GFR     Meds ordered this encounter  Medications  . atorvastatin (LIPITOR) 40 MG tablet    Sig: Take 1 tablet (40 mg total) by mouth daily.    Dispense:  90 tablet    Refill:  1  . lisinopril (ZESTRIL) 30 MG tablet    Sig: Take 1 tablet (30 mg total) by mouth daily.    Dispense:  90 tablet    Refill:  1      Follow-up plan: Return for virtual visit approx 1 week to review blood pressures. 3 mos recheck labs .                                                 ################################################# ################################################# ################################################# #################################################    No outpatient medications have been marked as taking for the 06/05/18 encounter (Office Visit) with Emeterio Reeve, DO.    No Known Allergies     Review of Systems:  Constitutional: No recent illness  HEENT: No  headache, no vision change  Cardiac: No  chest pain, No  pressure, No palpitations  Respiratory:  No  shortness of breath. No  Cough  Gastrointestinal: No  abdominal pain, no change on bowel habits  Musculoskeletal: No new myalgia/arthralgia  Neurologic: No  weakness, No  Dizziness  Psychiatric: No  concerns with depression, No  concerns with anxiety  Exam:  BP (!) 143/96 (BP Location: Left Arm, Patient Position: Sitting, Cuff Size: Normal)   Pulse 77   Temp 98.3 F (36.8 C) (Oral)   Wt 174 lb (78.9 kg)   BMI 35.14 kg/m   Constitutional: VS see above. General Appearance: alert, well-developed, well-nourished,  NAD  Eyes: Normal lids and conjunctive, non-icteric sclera  Ears, Nose, Mouth, Throat: MMM, Normal external inspection ears/nares/mouth/lips/gums.  Neck: No masses, trachea midline.   Respiratory: Normal respiratory effort. no wheeze, no rhonchi, no rales  Cardiovascular: S1/S2 normal, no murmur, no rub/gallop auscultated. RRR.   Musculoskeletal: Gait normal. Symmetric and independent movement of all extremities  Neurological: Normal balance/coordination. No tremor.  Skin: warm, dry, intact.   Psychiatric: Normal judgment/insight. Normal mood and affect. Oriented x3.       Visit summary with medication list and pertinent instructions was printed for patient to review, patient was advised to alert [place] if any updates are needed. All questions at time of visit were answered - patient instructed to contact office with any additional concerns. ER/RTC precautions were reviewed with the patient and understanding verbalized.     Please note: voice recognition software was used to produce this document, and typos may escape review. Please contact Dr. Sheppard Coil for any needed clarifications.    Follow up plan: Return for virtual visit approx 1 week to review blood pressures. 3 mos recheck labs .

## 2018-06-05 NOTE — Patient Instructions (Signed)
Prediabetes Eating Plan Prediabetes is a condition that causes blood sugar (glucose) levels to be higher than normal. This increases the risk for developing diabetes. In order to prevent diabetes from developing, your health care provider may recommend a diet and other lifestyle changes to help you:  Control your blood glucose levels.  Improve your cholesterol levels.  Manage your blood pressure. Your health care provider may recommend working with a diet and nutrition specialist (dietitian) to make a meal plan that is best for you. What are tips for following this plan? Lifestyle  Set weight loss goals with the help of your health care team. It is recommended that most people with prediabetes lose 7% of their current body weight.  Exercise for at least 30 minutes at least 5 days a week.  Attend a support group or seek ongoing support from a mental health counselor.  Take over-the-counter and prescription medicines only as told by your health care provider. Reading food labels  Read food labels to check the amount of fat, salt (sodium), and sugar in prepackaged foods. Avoid foods that have: ? Saturated fats. ? Trans fats. ? Added sugars.  Avoid foods that have more than 300 milligrams (mg) of sodium per serving. Limit your daily sodium intake to less than 2,300 mg each day. Shopping  Avoid buying pre-made and processed foods. Cooking  Cook with olive oil. Do not use butter, lard, or ghee.  Bake, broil, grill, or boil foods. Avoid frying. Meal planning   Work with your dietitian to develop an eating plan that is right for you. This may include: ? Tracking how many calories you take in. Use a food diary, notebook, or mobile application to track what you eat at each meal. ? Using the glycemic index (GI) to plan your meals. The index tells you how quickly a food will raise your blood glucose. Choose low-GI foods. These foods take a longer time to raise blood glucose.  Consider  following a Mediterranean diet. This diet includes: ? Several servings each day of fresh fruits and vegetables. ? Eating fish at least twice a week. ? Several servings each day of whole grains, beans, nuts, and seeds. ? Using olive oil instead of other fats. ? Moderate alcohol consumption. ? Eating small amounts of red meat and whole-fat dairy.  If you have high blood pressure, you may need to limit your sodium intake or follow a diet such as the DASH eating plan. DASH is an eating plan that aims to lower high blood pressure. What foods are recommended? The items listed below may not be a complete list. Talk with your dietitian about what dietary choices are best for you. Grains Whole grains, such as whole-wheat or whole-grain breads, crackers, cereals, and pasta. Unsweetened oatmeal. Bulgur. Barley. Quinoa. Brown rice. Corn or whole-wheat flour tortillas or taco shells. Vegetables Lettuce. Spinach. Peas. Beets. Cauliflower. Cabbage. Broccoli. Carrots. Tomatoes. Squash. Eggplant. Herbs. Peppers. Onions. Cucumbers. Brussels sprouts. Fruits Berries. Bananas. Apples. Oranges. Grapes. Papaya. Mango. Pomegranate. Kiwi. Grapefruit. Cherries. Meats and other protein foods Seafood. Poultry without skin. Lean cuts of pork and beef. Tofu. Eggs. Nuts. Beans. Dairy Low-fat or fat-free dairy products, such as yogurt, cottage cheese, and cheese. Beverages Water. Tea. Coffee. Sugar-free or diet soda. Seltzer water. Lowfat or no-fat milk. Milk alternatives, such as soy or almond milk. Fats and oils Olive oil. Canola oil. Sunflower oil. Grapeseed oil. Avocado. Walnuts. Sweets and desserts Sugar-free or low-fat pudding. Sugar-free or low-fat ice cream and other frozen treats.   Seasoning and other foods Herbs. Sodium-free spices. Mustard. Relish. Low-fat, low-sugar ketchup. Low-fat, low-sugar barbecue sauce. Low-fat or fat-free mayonnaise. What foods are not recommended? The items listed below may not be a  complete list. Talk with your dietitian about what dietary choices are best for you. Grains Refined white flour and flour products, such as bread, pasta, snack foods, and cereals. Vegetables Canned vegetables. Frozen vegetables with butter or cream sauce. Fruits Fruits canned with syrup. Meats and other protein foods Fatty cuts of meat. Poultry with skin. Breaded or fried meat. Processed meats. Dairy Full-fat yogurt, cheese, or milk. Beverages Sweetened drinks, such as sweet iced tea and soda. Fats and oils Butter. Lard. Ghee. Sweets and desserts Baked goods, such as cake, cupcakes, pastries, cookies, and cheesecake. Seasoning and other foods Spice mixes with added salt. Ketchup. Barbecue sauce. Mayonnaise. Summary  To prevent diabetes from developing, you may need to make diet and other lifestyle changes to help control blood sugar, improve cholesterol levels, and manage your blood pressure.  Set weight loss goals with the help of your health care team. It is recommended that most people with prediabetes lose 7 percent of their current body weight.  Consider following a Mediterranean diet that includes plenty of fresh fruits and vegetables, whole grains, beans, nuts, seeds, fish, lean meat, low-fat dairy, and healthy oils. This information is not intended to replace advice given to you by your health care provider. Make sure you discuss any questions you have with your health care provider. Document Released: 05/11/2014 Document Revised: 02/29/2016 Document Reviewed: 02/29/2016 Elsevier Interactive Patient Education  2019 Elsevier Inc.   High Cholesterol  High cholesterol is a condition in which the blood has high levels of a white, waxy, fat-like substance (cholesterol). The human body needs small amounts of cholesterol. The liver makes all the cholesterol that the body needs. Extra (excess) cholesterol comes from the food that we eat. Cholesterol is carried from the liver by the  blood through the blood vessels. If you have high cholesterol, deposits (plaques) may build up on the walls of your blood vessels (arteries). Plaques make the arteries narrower and stiffer. Cholesterol plaques increase your risk for heart attack and stroke. Work with your health care provider to keep your cholesterol levels in a healthy range. What increases the risk? This condition is more likely to develop in people who:  Eat foods that are high in animal fat (saturated fat) or cholesterol.  Are overweight.  Are not getting enough exercise.  Have a family history of high cholesterol. What are the signs or symptoms? There are no symptoms of this condition. How is this diagnosed? This condition may be diagnosed from the results of a blood test.  If you are older than age 30, your health care provider may check your cholesterol every 4-6 years.  You may be checked more often if you already have high cholesterol or other risk factors for heart disease. The blood test for cholesterol measures:  "Bad" cholesterol (LDL cholesterol). This is the main type of cholesterol that causes heart disease. The desired level for LDL is less than 100.  "Good" cholesterol (HDL cholesterol). This type helps to protect against heart disease by cleaning the arteries and carrying the LDL away. The desired level for HDL is 60 or higher.  Triglycerides. These are fats that the body can store or burn for energy. The desired number for triglycerides is lower than 150.  Total cholesterol. This is a measure of the total amount  of cholesterol in your blood, including LDL cholesterol, HDL cholesterol, and triglycerides. A healthy number is less than 200. How is this treated? This condition is treated with diet changes, lifestyle changes, and medicines. Diet changes  This may include eating more whole grains, fruits, vegetables, nuts, and fish.  This may also include cutting back on red meat and foods that have a  lot of added sugar. Lifestyle changes  Changes may include getting at least 40 minutes of aerobic exercise 3 times a week. Aerobic exercises include walking, biking, and swimming. Aerobic exercise along with a healthy diet can help you maintain a healthy weight.  Changes may also include quitting smoking. Medicines  Medicines are usually given if diet and lifestyle changes have failed to reduce your cholesterol to healthy levels.  Your health care provider may prescribe a statin medicine. Statin medicines have been shown to reduce cholesterol, which can reduce the risk of heart disease. Follow these instructions at home: Eating and drinking If told by your health care provider:  Eat chicken (without skin), fish, veal, shellfish, ground [place] breast, and round or loin cuts of red meat.  Do not eat fried foods or fatty meats, such as hot dogs and salami.  Eat plenty of fruits, such as apples.  Eat plenty of vegetables, such as broccoli, potatoes, and carrots.  Eat beans, peas, and lentils.  Eat grains such as barley, rice, couscous, and bulgur wheat.  Eat pasta without cream sauces.  Use skim or nonfat milk, and eat low-fat or nonfat yogurt and cheeses.  Do not eat or drink whole milk, cream, ice cream, egg yolks, or hard cheeses.  Do not eat stick margarine or tub margarines that contain trans fats (also called partially hydrogenated oils).  Do not eat saturated tropical oils, such as coconut oil and palm oil.  Do not eat cakes, cookies, crackers, or other baked goods that contain trans fats.  General instructions  Exercise as directed by your health care provider. Increase your activity level with activities such as gardening, walking, and taking the stairs.  Take over-the-counter and prescription medicines only as told by your health care provider.  Do not use any products that contain nicotine or tobacco, such as cigarettes and e-cigarettes. If you need help quitting,  ask your health care provider.  Keep all follow-up visits as told by your health care provider. This is important. Contact a health care provider if:  You are struggling to maintain a healthy diet or weight.  You need help to start on an exercise program.  You need help to stop smoking. Get help right away if:  You have chest pain.  You have trouble breathing. This information is not intended to replace advice given to you by your health care provider. Make sure you discuss any questions you have with your health care provider. Document Released: 12/25/2004 Document Revised: 07/23/2015 Document Reviewed: 06/25/2015 Elsevier Interactive Patient Education  [employer].

## 2018-06-06 ENCOUNTER — Telehealth: Payer: Self-pay | Admitting: Osteopathic Medicine

## 2018-06-06 NOTE — Telephone Encounter (Signed)
Attempted to contact Pt to be scheduled, no answer and no VM

## 2018-06-06 NOTE — Telephone Encounter (Signed)
-----   Message from Emeterio Reeve, DO sent at 05/27/2018  5:51 PM EDT ----- Shingles list

## 2018-07-08 ENCOUNTER — Other Ambulatory Visit: Payer: Self-pay

## 2018-07-08 ENCOUNTER — Ambulatory Visit: Payer: Commercial Managed Care - PPO | Admitting: *Deleted

## 2018-07-08 VITALS — Ht 59.0 in | Wt 167.0 lb

## 2018-07-08 DIAGNOSIS — Z1211 Encounter for screening for malignant neoplasm of colon: Secondary | ICD-10-CM

## 2018-07-08 MED ORDER — NA SULFATE-K SULFATE-MG SULF 17.5-3.13-1.6 GM/177ML PO SOLN: ORAL | 0 refills | Status: DC

## 2018-07-08 NOTE — Progress Notes (Signed)
Patient's pre-visit was done today over the phone with the patient due to COVID-19 pandemic. Name,DOB and address verified. Insurance verified. Packet of Prep instructions mailed to patient including copy of a consent form and pre-procedure patient acknowledgement form-pt is aware. Suprep $15 Coupon included. Patient understands to call us back with any questions or concerns. Patient denies any allergies to eggs or soy. Patient denies any problems with anesthesia/sedation. Patient denies any oxygen use at home. Patient denies taking any diet/weight loss medications or blood thinners.  Pt is aware that care partner will wait in the car in the parking lot; if they feel like they will be too hot to wait in the car; they may wait in the lobby.  We want them to wear a mask (we do not have any that we can provide them), practice social distancing, and we will check their temperatures when they get here.  I did remind patient that their care partner needs to stay in the parking lot the entire time. Pt will wear mask into building

## 2018-07-15 ENCOUNTER — Encounter: Payer: Self-pay | Admitting: Gastroenterology

## 2018-07-18 ENCOUNTER — Telehealth: Payer: Self-pay | Admitting: Gastroenterology

## 2018-07-18 NOTE — Telephone Encounter (Signed)

## 2018-07-21 ENCOUNTER — Ambulatory Visit (AMBULATORY_SURGERY_CENTER): Payer: Commercial Managed Care - PPO | Admitting: Gastroenterology

## 2018-07-21 ENCOUNTER — Other Ambulatory Visit: Payer: Self-pay

## 2018-07-21 ENCOUNTER — Encounter: Payer: Self-pay | Admitting: Gastroenterology

## 2018-07-21 VITALS — BP 99/45 | HR 63 | Temp 98.7°F | Resp 13 | Ht 59.0 in | Wt 167.0 lb

## 2018-07-21 DIAGNOSIS — Z1211 Encounter for screening for malignant neoplasm of colon: Secondary | ICD-10-CM

## 2018-07-21 MED ORDER — SODIUM CHLORIDE 0.9 % IV SOLN: 500.0000 mL | Freq: Once | INTRAVENOUS | Status: DC

## 2018-07-21 NOTE — Patient Instructions (Signed)
Handout given for hemorrhoids.  You don't need another colonoscopy for 10 years.  YOU HAD AN ENDOSCOPIC PROCEDURE TODAY AT [facility]:   Refer to the procedure report that was given to you for any specific questions about what was found during the examination.  If the procedure report does not answer your questions, please call your gastroenterologist to clarify.  If you requested that your care partner not be given the details of your procedure findings, then the procedure report has been included in a sealed envelope for you to review at your convenience later.  YOU SHOULD EXPECT: Some feelings of bloating in the abdomen. Passage of more gas than usual.  Walking can help get rid of the air that was put into your GI tract during the procedure and reduce the bloating. If you had a lower endoscopy (such as a colonoscopy or flexible sigmoidoscopy) you may notice spotting of blood in your stool or on the toilet paper. If you underwent a bowel prep for your procedure, you may not have a normal bowel movement for a few days.  Please Note:  You might notice some irritation and congestion in your nose or some drainage.  This is from the oxygen used during your procedure.  There is no need for concern and it should clear up in a day or so.  SYMPTOMS TO REPORT IMMEDIATELY:   Following lower endoscopy (colonoscopy or flexible sigmoidoscopy):  Excessive amounts of blood in the stool  Significant tenderness or worsening of abdominal pains  Swelling of the abdomen that is new, acute  Fever of 100F or higher For urgent or emergent issues, a gastroenterologist can be reached at any hour by calling [phone].   DIET:  We do recommend a small meal at first, but then you may proceed to your regular diet.  Drink plenty of fluids but you should avoid alcoholic beverages for 24 hours.  ACTIVITY:  You should plan to take it easy for the rest of today and you should NOT DRIVE or use heavy  machinery until tomorrow (because of the sedation medicines used during the test).    FOLLOW UP: Our staff will call the number listed on your records 48-72 hours following your procedure to check on you and address any questions or concerns that you may have regarding the information given to you following your procedure. If we do not reach you, we will leave a message.  We will attempt to reach you two times.  During this call, we will ask if you have developed any symptoms of COVID 19. If you develop any symptoms (ie: fever, flu-like symptoms, shortness of breath, cough etc.) before then, please call [phone].  If you test positive for Covid 19 in the 2 weeks post procedure, please call and report this information to [place].    If any biopsies were taken you will be contacted by phone or by letter within the next 1-3 weeks.  Please call us at [phone] if you have not heard about the biopsies in 3 weeks.    SIGNATURES/CONFIDENTIALITY: You and/or your care partner have signed paperwork which will be entered into your electronic medical record.  These signatures attest to the fact that that the information above on your After Visit Summary has been reviewed and is understood.  Full responsibility of the confidentiality of this discharge information lies with you and/or your care-partner.

## 2018-07-21 NOTE — Op Note (Signed)
[place] Patient Name: Ashley Haynes Procedure Date: 07/21/2018 2:19 PM MRN: [id number] Endoscopist: Mauri Pole , MD Age: 63 Referring MD:  Date of Birth: [birth year] Gender: Female Account #: [id number] Procedure:                Colonoscopy Indications:              Screening for colorectal malignant neoplasm Medicines:                Monitored Anesthesia Care Procedure:                Pre-Anesthesia Assessment:                           - Prior to the procedure, a History and Physical                            was performed, and patient medications and                            allergies were reviewed. The patient's tolerance of                            previous anesthesia was also reviewed. The risks                            and benefits of the procedure and the sedation                            options and risks were discussed with the patient.                            All questions were answered, and informed consent                            was obtained. Prior Anticoagulants: The patient has                            taken no previous anticoagulant or antiplatelet                            agents. ASA Grade Assessment: II - A patient with                            mild systemic disease. After reviewing the risks                            and benefits, the patient was deemed in                            satisfactory condition to undergo the procedure.                           After obtaining informed consent, the colonoscope  was passed under direct vision. Throughout the                            procedure, the patient's blood pressure, pulse, and                            oxygen saturations were monitored continuously. The                            Colonoscope was introduced through the anus and                            advanced to the the cecum, identified by                            appendiceal orifice  and ileocecal valve. The                            colonoscopy was performed without difficulty. The                            patient tolerated the procedure well. The quality                            of the bowel preparation was excellent. The                            ileocecal valve, appendiceal orifice, and rectum                            were photographed. Scope In: 2:23:28 PM Scope Out: 2:35:12 PM Scope Withdrawal Time: 0 hours 6 minutes 25 seconds  Total Procedure Duration: 0 hours 11 minutes 44 seconds  Findings:                 The perianal and digital rectal examinations were                            normal.                           Non-bleeding internal hemorrhoids were found during                            retroflexion. The hemorrhoids were small.                           The exam was otherwise without abnormality. Complications:            No immediate complications. Estimated Blood Loss:     Estimated blood loss was minimal. Impression:               - Non-bleeding internal hemorrhoids.                           - The examination was otherwise normal.                           -  No specimens collected. Recommendation:           - Patient has a contact number available for                            emergencies. The signs and symptoms of potential                            delayed complications were discussed with the                            patient. Return to normal activities tomorrow.                            Written discharge instructions were provided to the                            patient.                           - Resume previous diet.                           - Continue present medications.                           - Repeat colonoscopy in 10 years for screening                            purposes. Mauri Pole, MD 07/21/2018 2:45:50 PM This report has been signed electronically.

## 2018-07-21 NOTE — Progress Notes (Signed)
A/ox3, pleased with MAC, report to RN 

## 2018-07-23 ENCOUNTER — Telehealth: Payer: Self-pay

## 2018-07-23 ENCOUNTER — Telehealth: Payer: Self-pay | Admitting: *Deleted

## 2018-07-23 NOTE — Telephone Encounter (Signed)
Left voice mail

## 2018-07-23 NOTE — Telephone Encounter (Signed)
Unable to leave message on f/u call ,no answer and answering machine did not come on

## 2018-08-11 ENCOUNTER — Other Ambulatory Visit: Payer: Self-pay

## 2018-08-11 ENCOUNTER — Ambulatory Visit: Admission: RE | Admit: 2018-08-11 | Payer: Commercial Managed Care - PPO | Source: Ambulatory Visit

## 2018-08-14 ENCOUNTER — Telehealth: Payer: Self-pay | Admitting: Osteopathic Medicine

## 2018-08-14 NOTE — Telephone Encounter (Signed)
I am not sure what to different order she could be talking about.  I sent an order back in 06/04/2018, I have not heard anything from our imaging department or the breast center with regard to any confusion about orders.  Anytime there is question about something like this, they should contact me directly.  Can we figure out where she tried to go for this and reach out to them?

## 2018-08-14 NOTE — Telephone Encounter (Signed)
Patient states that she had a mammogram and they received two work orders, and they didn't know which one was the correct one to do. They turned patient away and canceled the appointment. They told her that someone would be in contact with her. Patient is confused and would like some direction on what needs to be done next. Please contact and advise.

## 2018-08-19 NOTE — Telephone Encounter (Signed)
Called the [facility]- this was an error on their part, they pulled incorrect order. Diagnostic had been ordered in May. Per [facility], no further action needed from [place] and pt is ready to be scheduled. I have called pt and advised her to call [facility] to get this scheduled.

## 2018-09-10 ENCOUNTER — Other Ambulatory Visit: Payer: Self-pay | Admitting: Osteopathic Medicine

## 2018-09-10 DIAGNOSIS — Z87898 Personal history of other specified conditions: Secondary | ICD-10-CM

## 2018-09-18 ENCOUNTER — Ambulatory Visit
Admission: RE | Admit: 2018-09-18 | Discharge: 2018-09-18 | Disposition: A | Discharge status: Home / Self Care | Payer: Commercial Managed Care - PPO | Source: Ambulatory Visit | Attending: Osteopathic Medicine | Admitting: Osteopathic Medicine

## 2018-09-18 ENCOUNTER — Other Ambulatory Visit: Payer: Self-pay

## 2018-09-18 ENCOUNTER — Ambulatory Visit: Payer: Commercial Managed Care - PPO

## 2018-09-18 DIAGNOSIS — Z87898 Personal history of other specified conditions: Secondary | ICD-10-CM

## 2018-11-03 ENCOUNTER — Other Ambulatory Visit: Payer: Self-pay | Admitting: Osteopathic Medicine

## 2018-11-03 DIAGNOSIS — I10 Essential (primary) hypertension: Secondary | ICD-10-CM

## 2018-12-15 ENCOUNTER — Other Ambulatory Visit: Payer: Self-pay | Admitting: Osteopathic Medicine

## 2018-12-15 DIAGNOSIS — E785 Hyperlipidemia, unspecified: Secondary | ICD-10-CM

## 2018-12-15 NOTE — Telephone Encounter (Signed)
Needs appointment

## 2018-12-15 NOTE — Telephone Encounter (Signed)
Requested medication (s) are due for refill today: yes  Requested medication (s) are on the active medication list:yes  Last refill:  08/31/2018  Future visit scheduled:no  Notes to clinic: review for refill    Requested Prescriptions  Pending Prescriptions Disp Refills   atorvastatin (LIPITOR) 40 MG tablet [Pharmacy Med Name: ATORVASTATIN 40 MG TABLET] 90 tablet 1    Sig: TAKE 1 TABLET BY MOUTH EVERY DAY     Cardiovascular:  Antilipid - Statins Failed - 12/15/2018 12:41 PM      Failed - Total Cholesterol in normal range and within 360 days    Cholesterol  Date Value Ref Range Status  06/03/2018 294 (H) <200 mg/dL Final         Failed - LDL in normal range and within 360 days    LDL Cholesterol (Calc)  Date Value Ref Range Status  06/03/2018 212 (H) mg/dL (calc) Final    Comment:    LDL-C levels > or = 190 mg/dL may indicate familial  hypercholesterolemia (FH). Clinical assessment and  measurement of blood lipid levels should be  considered for all first degree relatives of  patients with an FH diagnosis.  For questions about testing for familial hypercholesterolemia, please call Insurance risk surveyor at [email or web address]. Duncan Dull, et al. J National Lipid Association  Recommendations for Patient-Centered Management of  Dyslipidemia: Part 1 Journal of Clinical Lipidology  2015;9(2), 129-169. Reference range: <100 . Desirable range <100 mg/dL for primary prevention;   <70 mg/dL for patients with CHD or diabetic patients  with > or = 2 CHD risk factors. Marland Kitchen LDL-C is now calculated using the Martin-Hopkins  calculation, which is a validated novel method providing  better accuracy than the Friedewald equation in the  estimation of LDL-C.  Cresenciano Genre et al. Annamaria Helling. [id number]): 2061-2068  (http://education.Quest[employer].com/faq/FAQ164)          Passed - HDL in normal range and within 360 days    HDL  Date Value Ref Range Status  06/03/2018 61 > OR = 50  mg/dL Final         Passed - Triglycerides in normal range and within 360 days    Triglycerides  Date Value Ref Range Status  06/03/2018 94 <150 mg/dL Final         Passed - Patient is not pregnant      Passed - Valid encounter within last 12 months    Recent Outpatient Visits          6 months ago Essential hypertension, benign   [facility], Lanelle Bal, DO   6 months ago Annual physical exam   [facility], Lanelle Bal, DO   11 months ago Acute non-recurrent frontal sinusitis   [facility], Elson Areas, PA-C   3 years ago Prediabetes   [facility], Lanelle Bal, DO   3 years ago Rash and nonspecific skin eruption   [facility], Royetta Car, PA-C

## 2019-01-11 ENCOUNTER — Other Ambulatory Visit: Payer: Self-pay | Admitting: Osteopathic Medicine

## 2019-01-11 DIAGNOSIS — E785 Hyperlipidemia, unspecified: Secondary | ICD-10-CM

## 2019-01-15 ENCOUNTER — Other Ambulatory Visit: Payer: Self-pay | Admitting: Osteopathic Medicine

## 2019-01-15 DIAGNOSIS — I10 Essential (primary) hypertension: Secondary | ICD-10-CM

## 2019-01-15 NOTE — Telephone Encounter (Signed)
Sent to wrong provider

## 2019-03-03 ENCOUNTER — Ambulatory Visit (INDEPENDENT_AMBULATORY_CARE_PROVIDER_SITE_OTHER): Payer: Commercial Managed Care - PPO | Admitting: Osteopathic Medicine

## 2019-03-03 ENCOUNTER — Encounter: Payer: Self-pay | Admitting: Osteopathic Medicine

## 2019-03-03 ENCOUNTER — Other Ambulatory Visit: Payer: Self-pay

## 2019-03-03 VITALS — BP 129/81 | HR 90 | Temp 97.8°F | Ht 59.0 in | Wt 164.4 lb

## 2019-03-03 DIAGNOSIS — E785 Hyperlipidemia, unspecified: Secondary | ICD-10-CM | POA: Diagnosis not present

## 2019-03-03 DIAGNOSIS — I10 Essential (primary) hypertension: Secondary | ICD-10-CM | POA: Diagnosis not present

## 2019-03-03 DIAGNOSIS — R7303 Prediabetes: Secondary | ICD-10-CM | POA: Diagnosis not present

## 2019-03-03 DIAGNOSIS — R413 Other amnesia: Secondary | ICD-10-CM

## 2019-03-03 MED ORDER — LISINOPRIL 30 MG PO TABS: 30.0000 mg | ORAL_TABLET | Freq: Every day | ORAL | 0 refills | Status: DC

## 2019-03-03 MED ORDER — ATORVASTATIN CALCIUM 40 MG PO TABS: 40.0000 mg | ORAL_TABLET | Freq: Every day | ORAL | 0 refills | Status: DC

## 2019-03-03 NOTE — Patient Instructions (Addendum)
Plan: Will get labs Consider medications for anxiety / sleep

## 2019-03-03 NOTE — Progress Notes (Signed)
Ashley Haynes is a 64 y.o. female who presents to  [place] at [facility]  today, 03/03/19, seeking care for the following:  The primary encounter diagnosis was Memory changes. Diagnoses of Essential hypertension, benign, Hyperlipidemia, and Prediabetes were also pertinent to this visit.    ASSESSMENT & PLAN with other pertinent history/findings:  1. Essential hypertension, benign controlled  2. Hyperlipidemia Needs repeat labs  3. Prediabetes Needs repepat labs  4. Memory changes  New problem, ongoing maybe 6+ months, stable over that time   Compared to this time last year:  Increased anxiety, decreased sleep  Difficulty remembering recent conversations  Difficulty focusing  Less active  No FH early dementia  DDx: anxiety/insomnia causing concentration deficit, metabolic issue (labs pending), early dementia seems less likely but possible, no red flags for serious neurologic condition such as dementia, MS, mass effect, CVA/TIA   Plan: labs pending, pt will consider SSRI, possibly MRI/Neuro referral       Orders Placed This Encounter  Procedures  . CBC  . COMPLETE METABOLIC PANEL WITH GFR  . Lipid panel  . Hemoglobin A1c  . TSH  . Vitamin B12    Meds ordered this encounter  Medications  . lisinopril (ZESTRIL) 30 MG tablet    Sig: Take 1 tablet (30 mg total) by mouth daily.    Dispense:  90 tablet    Refill:  0  . atorvastatin (LIPITOR) 40 MG tablet    Sig: Take 1 tablet (40 mg total) by mouth daily.    Dispense:  90 tablet    Refill:  0    Patient Instructions  Plan: Will get labs Consider medications for anxiety / sleep         Follow-up instructions: Return for RECHECK PENDING RESULTS / IF WORSE OR CHANGE.         BP 129/81   Pulse 90   Temp 97.8 F (36.6 C)   Ht 4\' 11"  (1.499 m)   Wt 164 lb 6.4 oz (74.6 kg)   SpO2 97%   BMI 33.20 kg/m   Current Meds  Medication Sig  .  atorvastatin (LIPITOR) 40 MG tablet Take 1 tablet (40 mg total) by mouth daily.  Marland Kitchen CALCIUM PO Take by mouth.  . Cholecalciferol (VITAMIN D3 PO) Take by mouth.  . Flaxseed, Linseed, (FLAX SEEDS PO) Take by mouth.  Marland Kitchen GARLIC PO Take by mouth.  . latanoprost (XALATAN) 0.005 % ophthalmic solution INSTILL 1 DROP INTO BOTH EYES IN THE EVENING  . lisinopril (ZESTRIL) 30 MG tablet Take 1 tablet (30 mg total) by mouth daily.  Marland Kitchen MAGNESIUM PO Take by mouth.  . Multiple Vitamin (MULTIVITAMIN) tablet Take 1 tablet by mouth daily.  Marland Kitchen NIACIN PO Take by mouth.  . Omega-3 Fatty Acids (FISH OIL PO) Take by mouth.  Marland Kitchen VITAMIN E PO Take by mouth.  . [DISCONTINUED] atorvastatin (LIPITOR) 40 MG tablet TAKE 1 TABLET (40 MG TOTAL) BY MOUTH DAILY. NEEDS APPOINTMENT  . [DISCONTINUED] lisinopril (ZESTRIL) 30 MG tablet TAKE 1 TABLET BY MOUTH EVERY DAY    No results found for this or any previous visit (from the past 72 hour(s)).  No results found.  Depression screen [facility] 2/9 05/27/2018  Decreased Interest 0  Down, Depressed, Hopeless 0  PHQ - 2 Score 0  Altered sleeping 1  Tired, decreased energy 0  Change in appetite 1  Feeling bad or failure about yourself  0  Trouble concentrating 0  Moving slowly or fidgety/restless 0  Suicidal thoughts 0  PHQ-9 Score 2    GAD 7 : Generalized Anxiety Score 05/27/2018  Nervous, Anxious, on Edge 1  Control/stop worrying 0  Worry too much - different things 1  Trouble relaxing 1  Restless 0  Easily annoyed or irritable 0  Afraid - awful might happen 0  Total GAD 7 Score 3  Anxiety Difficulty Not difficult at all      All questions at time of visit were answered - patient instructed to contact office with any additional concerns or updates.  ER/RTC precautions were reviewed with the patient.  Please note: voice recognition software was used to produce this document, and typos may escape review. Please contact Dr. Sheppard Coil for any needed clarifications.

## 2019-03-05 LAB — CBC
HCT: 44.8 % (ref 35.0–45.0)
Hemoglobin: 15 g/dL (ref 11.7–15.5)
MCH: 28.2 pg (ref 27.0–33.0)
MCHC: 33.5 g/dL (ref 32.0–36.0)
MCV: 84.4 fL (ref 80.0–100.0)
MPV: 9.2 fL (ref 7.5–12.5)
Platelets: 275 10*3/uL (ref 140–400)
RBC: 5.31 10*6/uL — ABNORMAL HIGH (ref 3.80–5.10)
RDW: 13.4 % (ref 11.0–15.0)
WBC: 4.2 10*3/uL (ref 3.8–10.8)

## 2019-03-05 LAB — COMPLETE METABOLIC PANEL WITH GFR
AG Ratio: 1.2 (calc) (ref 1.0–2.5)
ALT: 19 U/L (ref 6–29)
AST: 21 U/L (ref 10–35)
Albumin: 4.3 g/dL (ref 3.6–5.1)
Alkaline phosphatase (APISO): 91 U/L (ref 37–153)
BUN: 15 mg/dL (ref 7–25)
CO2: 30 mmol/L (ref 20–32)
Calcium: 10.2 mg/dL (ref 8.6–10.4)
Chloride: 100 mmol/L (ref 98–110)
Creat: 0.86 mg/dL (ref 0.50–0.99)
GFR, Est African American: 83 mL/min/{1.73_m2} (ref >=60)
GFR, Est Non African American: 71 mL/min/{1.73_m2} (ref >=60)
Globulin: 3.5 g/dL (calc) (ref 1.9–3.7)
Glucose, Bld: 99 mg/dL (ref 65–99)
Potassium: 4.8 mmol/L (ref 3.5–5.3)
Sodium: 136 mmol/L (ref 135–146)
Total Bilirubin: 0.4 mg/dL (ref 0.2–1.2)
Total Protein: 7.8 g/dL (ref 6.1–8.1)

## 2019-03-05 LAB — LIPID PANEL
Cholesterol: 221 mg/dL — ABNORMAL HIGH (ref <200)
HDL: 58 mg/dL (ref >=50)
LDL Cholesterol (Calc): 143 mg/dL (calc) — ABNORMAL HIGH
Non-HDL Cholesterol (Calc): 163 mg/dL (calc) — ABNORMAL HIGH (ref <130)
Total CHOL/HDL Ratio: 3.8 (calc) (ref <5.0)
Triglycerides: 95 mg/dL (ref <150)

## 2019-03-05 LAB — TSH: TSH: 1.3 mIU/L (ref 0.40–4.50)

## 2019-03-05 LAB — HEMOGLOBIN A1C
Hgb A1c MFr Bld: 5.9 % of total Hgb — ABNORMAL HIGH (ref <5.7)
Mean Plasma Glucose: 123 (calc)
eAG (mmol/L): 6.8 (calc)

## 2019-03-05 LAB — VITAMIN B12: Vitamin B-12: 547 pg/mL (ref 200–1100)

## 2019-03-06 NOTE — Addendum Note (Signed)
Addended by: Maryla Morrow on: 03/06/2019 12:32 PM   Modules accepted: Orders

## 2019-03-09 ENCOUNTER — Ambulatory Visit: Payer: Commercial Managed Care - PPO | Attending: Internal Medicine

## 2019-03-09 DIAGNOSIS — Z23 Encounter for immunization: Secondary | ICD-10-CM | POA: Insufficient documentation

## 2019-03-09 NOTE — Progress Notes (Signed)
   Covid-19 Vaccination Clinic  Name:  Ashley Haynes    MRN: [id number] DOB: April 09, 1955  03/09/2019  [name] was observed post Covid-19 immunization for 15 minutes without incidence. She was provided with Vaccine Information Sheet and instruction to access the V-Safe system.   [name] was instructed to call 911 with any severe reactions post vaccine: Marland Kitchen Difficulty breathing  . Swelling of your face and throat  . A fast heartbeat  . A bad rash all over your body  . Dizziness and weakness    Immunizations Administered    Name Date Dose VIS Date Route   Pfizer COVID-19 Vaccine 03/09/2019  6:29 PM 0.3 mL 12/19/2018 Intramuscular   Manufacturer: [place]   Lot: [id number]   [place]: [id number]

## 2019-03-16 ENCOUNTER — Ambulatory Visit (INDEPENDENT_AMBULATORY_CARE_PROVIDER_SITE_OTHER): Payer: Commercial Managed Care - PPO

## 2019-03-16 ENCOUNTER — Other Ambulatory Visit: Payer: Self-pay

## 2019-03-16 DIAGNOSIS — R413 Other amnesia: Secondary | ICD-10-CM | POA: Diagnosis not present

## 2019-03-16 MED ORDER — GADOBUTROL 1 MMOL/ML IV SOLN
7.5000 mL | Freq: Once | INTRAVENOUS | Status: AC | PRN
  Administered 2019-03-16: 7.5 mL via INTRAVENOUS

## 2019-03-31 ENCOUNTER — Ambulatory Visit: Payer: Self-pay | Attending: Internal Medicine

## 2019-03-31 ENCOUNTER — Ambulatory Visit: Payer: Commercial Managed Care - PPO

## 2019-03-31 DIAGNOSIS — Z23 Encounter for immunization: Secondary | ICD-10-CM

## 2019-03-31 NOTE — Progress Notes (Signed)
   Covid-19 Vaccination Clinic  Name:  Ashley Haynes    MRN: [id number] DOB: 08-15-1955  03/31/2019  [name] was observed post Covid-19 immunization for 15 minutes without incident. She was provided with Vaccine Information Sheet and instruction to access the V-Safe system.   [name] was instructed to call 911 with any severe reactions post vaccine: Marland Kitchen Difficulty breathing  . Swelling of face and throat  . A fast heartbeat  . A bad rash all over body  . Dizziness and weakness   Immunizations Administered    Name Date Dose VIS Date Route   Pfizer COVID-19 Vaccine 03/31/2019  1:33 PM 0.3 mL 12/19/2018 Intramuscular   Manufacturer: [place]   Lot: [id number]   [place]: [id number]

## 2019-04-25 ENCOUNTER — Other Ambulatory Visit: Payer: Self-pay | Admitting: Osteopathic Medicine

## 2019-04-25 DIAGNOSIS — I10 Essential (primary) hypertension: Secondary | ICD-10-CM

## 2019-05-20 ENCOUNTER — Other Ambulatory Visit: Payer: Self-pay

## 2019-05-20 ENCOUNTER — Encounter: Payer: Self-pay | Admitting: Osteopathic Medicine

## 2019-05-20 ENCOUNTER — Ambulatory Visit (INDEPENDENT_AMBULATORY_CARE_PROVIDER_SITE_OTHER): Payer: Commercial Managed Care - PPO | Admitting: Osteopathic Medicine

## 2019-05-20 VITALS — BP 132/74 | HR 80 | Temp 98.0°F | Wt 162.1 lb

## 2019-05-20 DIAGNOSIS — L84 Corns and callosities: Secondary | ICD-10-CM

## 2019-05-20 NOTE — Progress Notes (Signed)
Ashley Haynes is a 64 y.o. female who presents to  [place] at [facility]  today, 05/20/19, seeking care for the following: . Concern on skin of foot -      ASSESSMENT & PLAN with other pertinent history/findings:  The encounter diagnosis was Corn or callus.  hiking w/ nephew a few months ago, since then felt a bump on her lateral R sole of foot getting worse over time. On exam, looks like callus/corn. Shaved off with dermablade, no blood loss, pt tolerated wel, advised gentle use pumice stone in shower to keep smooth and let me know if no tgetting better.        Follow-up instructions: Return if symptoms worsen or fail to improve.                                         BP 132/74 (BP Location: Left Arm, Patient Position: Sitting, Cuff Size: Normal)   Pulse 80   Temp 98 F (36.7 C) (Oral)   Wt 162 lb 1.9 oz (73.5 kg)   BMI 32.74 kg/m   Current Meds  Medication Sig  . atorvastatin (LIPITOR) 40 MG tablet Take 1 tablet (40 mg total) by mouth daily.  Marland Kitchen CALCIUM PO Take by mouth.  . Cholecalciferol (VITAMIN D3 PO) Take by mouth.  . Flaxseed, Linseed, (FLAX SEEDS PO) Take by mouth.  Marland Kitchen GARLIC PO Take by mouth.  . latanoprost (XALATAN) 0.005 % ophthalmic solution INSTILL 1 DROP INTO BOTH EYES IN THE EVENING  . lisinopril (ZESTRIL) 30 MG tablet TAKE 1 TABLET BY MOUTH EVERY DAY  . MAGNESIUM PO Take by mouth.  . Multiple Vitamin (MULTIVITAMIN) tablet Take 1 tablet by mouth daily.  Marland Kitchen NIACIN PO Take by mouth.  . Omega-3 Fatty Acids (FISH OIL PO) Take by mouth.  Marland Kitchen VITAMIN E PO Take by mouth.    No results found for this or any previous visit (from the past 72 hour(s)).  No results found.  Depression screen [facility] 2/9 05/27/2018  Decreased Interest 0  Down, Depressed, Hopeless 0  PHQ - 2 Score 0  Altered sleeping 1  Tired, decreased energy 0  Change in appetite 1  Feeling bad or failure about  yourself  0  Trouble concentrating 0  Moving slowly or fidgety/restless 0  Suicidal thoughts 0  PHQ-9 Score 2    GAD 7 : Generalized Anxiety Score 05/27/2018  Nervous, Anxious, on Edge 1  Control/stop worrying 0  Worry too much - different things 1  Trouble relaxing 1  Restless 0  Easily annoyed or irritable 0  Afraid - awful might happen 0  Total GAD 7 Score 3  Anxiety Difficulty Not difficult at all      All questions at time of visit were answered - patient instructed to contact office with any additional concerns or updates.  ER/RTC precautions were reviewed with the patient.  Please note: voice recognition software was used to produce this document, and typos may escape review. Please contact Dr. Sheppard Coil for any needed clarifications.

## 2019-05-26 ENCOUNTER — Other Ambulatory Visit: Payer: Self-pay | Admitting: Osteopathic Medicine

## 2019-05-26 DIAGNOSIS — E785 Hyperlipidemia, unspecified: Secondary | ICD-10-CM

## 2019-08-05 ENCOUNTER — Encounter: Payer: Self-pay | Admitting: Osteopathic Medicine

## 2019-08-05 ENCOUNTER — Ambulatory Visit (INDEPENDENT_AMBULATORY_CARE_PROVIDER_SITE_OTHER): Payer: Commercial Managed Care - PPO | Admitting: Osteopathic Medicine

## 2019-08-05 VITALS — BP 129/69 | HR 69 | Wt 160.0 lb

## 2019-08-05 DIAGNOSIS — R413 Other amnesia: Secondary | ICD-10-CM | POA: Diagnosis not present

## 2019-08-05 DIAGNOSIS — R251 Tremor, unspecified: Secondary | ICD-10-CM

## 2019-08-05 NOTE — Progress Notes (Signed)
Ashley Haynes is a 64 y.o. female who presents to  [place] at [facility]  today, 08/05/19, seeking care for the following:  Marland Kitchen Memory problems - short term seems a bit worse but ongoing over a year or more. Husband notes she is having difficulty with short-term memory but not forgetting close family/friends, getting lost, exhibiting dangerous forgetfulness like wandering or leaving stove on etc. Difficulty multitasking.  . Hand tremor ongoing about a year, intention tremor worse w/ writing. On exam, minimal intention tremor, no resting tremor or pill-rolling.      ASSESSMENT & PLAN with other pertinent findings:  The primary encounter diagnosis was Memory changes. A diagnosis of Tremor was also pertinent to this visit.   RTC for [facility] Neuro referral    Patient Instructions  Plan: Will return here for cognitive testing w/ me  I've ordered referral to neurology - in process  Call ASAP if severe change!      Orders Placed This Encounter  Procedures  . Ambulatory referral to Neurology    No orders of the defined types were placed in this encounter.      Follow-up instructions: Return for COGNITIVE TESTING W/ DR Sheppard Coil IN 2-3 WEEKS, VISIT SAME DAY W/ DR T FOR ORTHO .                                         BP (!) 129/69 (BP Location: Left Arm, Patient Position: Sitting)   Pulse 69   Wt 160 lb (72.6 kg)   SpO2 100%   BMI 32.32 kg/m   Current Meds  Medication Sig  . atorvastatin (LIPITOR) 40 MG tablet TAKE 1 TABLET BY MOUTH EVERY DAY  . CALCIUM PO Take by mouth.  . Cholecalciferol (VITAMIN D3 PO) Take by mouth.  . Flaxseed, Linseed, (FLAX SEEDS PO) Take by mouth.  Marland Kitchen GARLIC PO Take by mouth.  . latanoprost (XALATAN) 0.005 % ophthalmic solution INSTILL 1 DROP INTO BOTH EYES IN THE EVENING  . lisinopril (ZESTRIL) 30 MG tablet TAKE 1 TABLET BY MOUTH EVERY DAY  . MAGNESIUM PO Take by  mouth.  . Multiple Vitamin (MULTIVITAMIN) tablet Take 1 tablet by mouth daily.  Marland Kitchen NIACIN PO Take by mouth.  . Omega-3 Fatty Acids (FISH OIL PO) Take by mouth.  Marland Kitchen VITAMIN E PO Take by mouth.    No results found for this or any previous visit (from the past 72 hour(s)).  No results found.     All questions at time of visit were answered - patient instructed to contact office with any additional concerns or updates.  ER/RTC precautions were reviewed with the patient as applicable.   Please note: voice recognition software was used to produce this document, and typos may escape review. Please contact Dr. Sheppard Coil for any needed clarifications.   Total encounter time: 30 minutes.

## 2019-08-05 NOTE — Patient Instructions (Signed)
Plan: Will return here for cognitive testing w/ me  I've ordered referral to neurology - in process  Call ASAP if severe change!

## 2019-08-14 ENCOUNTER — Ambulatory Visit (INDEPENDENT_AMBULATORY_CARE_PROVIDER_SITE_OTHER): Payer: Commercial Managed Care - PPO | Admitting: Osteopathic Medicine

## 2019-08-14 ENCOUNTER — Encounter: Payer: Commercial Managed Care - PPO | Admitting: Sports Medicine

## 2019-08-14 ENCOUNTER — Other Ambulatory Visit: Payer: Self-pay

## 2019-08-14 ENCOUNTER — Encounter: Payer: Self-pay | Admitting: Osteopathic Medicine

## 2019-08-14 ENCOUNTER — Ambulatory Visit: Payer: Commercial Managed Care - PPO | Admitting: Osteopathic Medicine

## 2019-08-14 VITALS — BP 112/61 | HR 68 | Temp 98.0°F | Wt 162.0 lb

## 2019-08-14 DIAGNOSIS — R413 Other amnesia: Secondary | ICD-10-CM | POA: Diagnosis not present

## 2019-08-14 NOTE — Progress Notes (Signed)
Ashley Haynes is a 64 y.o. female who presents to  [place] at [facility]  today, 08/14/19, seeking care for the following:  . Plan visit for further cognitive assessment with [place] with other pertinent findings:  The encounter diagnosis was Memory changes.  NO severe impairment/deficit based on [facility] Delayed recall not great  Neurology referral is in place, looks like they have called the patient and left a voicemail, patient was given their number and encouraged to call them back.  [facility] Cognitive Assessment  08/14/2019  Visuospatial/ Executive (0/5) 5  Naming (0/3) 3  Attention: Read list of digits (0/2) 2  Attention: Read list of letters (0/1) 1  Attention: Serial 7 subtraction starting at 100 (0/3) 2  Language: Repeat phrase (0/2) 2  Language : Fluency (0/1) 0  Abstraction (0/2) 2  Delayed Recall (0/5) 1  Orientation (0/6) 5  Total 23  Adjusted Score (based on education) 23        Follow-up instructions: Return for pendig neurology visit .                                         BP 112/61 (BP Location: Left Arm, Patient Position: Sitting)   Pulse 68   Temp 98 F (36.7 C)   Wt 162 lb (73.5 kg)   SpO2 100%   BMI 32.72 kg/m   Current Meds  Medication Sig  . atorvastatin (LIPITOR) 40 MG tablet TAKE 1 TABLET BY MOUTH EVERY DAY  . CALCIUM PO Take by mouth.  . Cholecalciferol (VITAMIN D3 PO) Take by mouth.  . Flaxseed, Linseed, (FLAX SEEDS PO) Take by mouth.  Marland Kitchen GARLIC PO Take by mouth.  . latanoprost (XALATAN) 0.005 % ophthalmic solution INSTILL 1 DROP INTO BOTH EYES IN THE EVENING  . lisinopril (ZESTRIL) 30 MG tablet TAKE 1 TABLET BY MOUTH EVERY DAY  . MAGNESIUM PO Take by mouth.  . Multiple Vitamin (MULTIVITAMIN) tablet Take 1 tablet by mouth daily.  Marland Kitchen NIACIN PO Take by mouth.  . Omega-3 Fatty Acids (FISH OIL PO) Take by mouth.  Marland Kitchen VITAMIN E PO Take  by mouth.    No results found for this or any previous visit (from the past 72 hour(s)).  No results found.     All questions at time of visit were answered - patient instructed to contact office with any additional concerns or updates.  ER/RTC precautions were reviewed with the patient as applicable.   Please note: voice recognition software was used to produce this document, and typos may escape review. Please contact Dr. Sheppard Coil for any needed clarifications.   Total encounter time: 30 minutes.

## 2019-08-18 ENCOUNTER — Ambulatory Visit (INDEPENDENT_AMBULATORY_CARE_PROVIDER_SITE_OTHER): Payer: Commercial Managed Care - PPO | Admitting: Neurology

## 2019-08-18 ENCOUNTER — Encounter: Payer: Self-pay | Admitting: Neurology

## 2019-08-18 ENCOUNTER — Other Ambulatory Visit: Payer: Self-pay

## 2019-08-18 ENCOUNTER — Ambulatory Visit (INDEPENDENT_AMBULATORY_CARE_PROVIDER_SITE_OTHER): Payer: Commercial Managed Care - PPO | Admitting: Sports Medicine

## 2019-08-18 ENCOUNTER — Ambulatory Visit (INDEPENDENT_AMBULATORY_CARE_PROVIDER_SITE_OTHER): Payer: Commercial Managed Care - PPO

## 2019-08-18 VITALS — BP 146/82 | HR 77 | Ht 59.0 in | Wt 164.3 lb

## 2019-08-18 DIAGNOSIS — R410 Disorientation, unspecified: Secondary | ICD-10-CM | POA: Diagnosis not present

## 2019-08-18 DIAGNOSIS — G8929 Other chronic pain: Secondary | ICD-10-CM

## 2019-08-18 DIAGNOSIS — M25551 Pain in right hip: Secondary | ICD-10-CM

## 2019-08-18 HISTORY — DX: Other chronic pain: G89.29

## 2019-08-18 HISTORY — DX: Pain in right hip: M25.551

## 2019-08-18 MED ORDER — CELECOXIB 200 MG PO CAPS: ORAL_CAPSULE | ORAL | 2 refills | Status: DC

## 2019-08-18 NOTE — Assessment & Plan Note (Signed)
This is a very pleasant 64 year old female with chronic right hip pain, she localizes it anterior lateral hip, she has no tenderness over the greater trochanter, I am able to reproduce pain with internal rotation which indicates a femoral acetabular joint source of pain, adding low-dose Celebrex, x-rays, hip osteoarthritis rehab exercises, return to see me in 1 month as needed.

## 2019-08-18 NOTE — Progress Notes (Signed)
    Procedures performed today:    None.  Independent interpretation of notes and tests performed by another provider:   None.  Brief History, Exam, Impression, and Recommendations:    Ashley Haynes is a pleasant 64yo female with chronic right hip pain. She has pain in the anterolateral hip. There is no pain upon palpation of the greater trochanter. She rates the pain a 3/10 and constatnt throughout the day. There is pain upon internal rotation of the hip. This is most likely early OA. We are going to start her on celebrex and give her hip exercises to do. She is also going to get an Xray of the hip today. We will see her back in around 4 weeks.  Marcelino Duster, MS3   ___________________________________________ Gwen Her. Dianah Field, M.D., ABFM., CAQSM. Primary Care and [place] Instructor of [place] [employer] [facility]

## 2019-08-18 NOTE — Progress Notes (Signed)
HISTORICAL  Ashley Haynes is a 64 year old female, seen in request by her primary care physician Dr. Emeterio Reeve, for evaluation of memory loss, right hand tremor, initial evaluation was on August 18, 2019   I reviewed and summarized the referring note.  Past medical history Hypertension,  She retired from [employer] job in 2019, denies difficulty handling her job, has become less active since retirement, she reported sometimes she walked down the hallway, forgot why she was there, after a while, it will come back to her, sometimes she started thought, difficult to carry through.  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  ALLERGIES: No Known Allergies  HOME MEDICATIONS: Current Outpatient Medications  Medication Sig Dispense Refill  . atorvastatin (LIPITOR) 40 MG tablet TAKE 1 TABLET BY MOUTH EVERY DAY 90 tablet 2  . CALCIUM PO Take by mouth.    . Cholecalciferol (VITAMIN D3 PO) Take by mouth.    . Flaxseed, Linseed, (FLAX SEEDS PO) Take by mouth.    Marland Kitchen GARLIC PO Take by mouth.    . latanoprost (XALATAN) 0.005 % ophthalmic solution INSTILL 1 DROP INTO BOTH EYES IN THE EVENING    . lisinopril (ZESTRIL) 30 MG tablet TAKE 1 TABLET BY MOUTH EVERY DAY 90 tablet 3  . MAGNESIUM PO Take by mouth.    . Multiple  Vitamin (MULTIVITAMIN) tablet Take 1 tablet by mouth daily.    Marland Kitchen NIACIN PO Take by mouth.    . Omega-3 Fatty Acids (FISH OIL PO) Take by mouth.    Marland Kitchen VITAMIN E PO Take by mouth.     No current facility-administered medications for this visit.    PAST MEDICAL HISTORY: Past Medical History:  Diagnosis Date  . Cardiac risk counseling 03/11/2015   Ten-year ASCVD risk 15.1% (calculated 03/11/2015) given hyperlipidemia, moderately well-controlled blood pressure, age. Prediabetes is also a concern, as well as increased BMI  . Former smoker 11/30/2014   Quit 09/1988  . Hyperlipidemia   . Hypertension   . Menopause age 71    PAST SURGICAL HISTORY: Past Surgical History:  Procedure Laterality Date  . CESAREAN SECTION    . COLONOSCOPY  07/04/2006  . ltcs      FAMILY HISTORY: Family History  Problem Relation Age of Onset  . Heart disease Mother 55       MI  . Diabetes Mother   . Glaucoma Mother   . Hyperlipidemia Sister   . Colon cancer Neg Hx   . Colon polyps Neg Hx   . Esophageal cancer Neg Hx   . Rectal cancer Neg Hx   . Stomach cancer Neg Hx     SOCIAL HISTORY: Social History   Socioeconomic History  . Marital status: Married    Spouse name: Not on file  . Number of children: Not on  file  . Years of education: Not on file  . Highest education level: Not on file  Occupational History  . Not on file  Tobacco Use  . Smoking status: Never Smoker  . Smokeless tobacco: Never Used  Vaping Use  . Vaping Use: Never used  Substance and Sexual Activity  . Alcohol use: No  . Drug use: No  . Sexual activity: Not Currently  Other Topics Concern  . Not on file  Social History Narrative  . Not on file   Social Determinants of Health   Financial Resource Strain:   . Difficulty of Paying Living Expenses:   Food Insecurity:   . Worried About Charity fundraiser in the Last Year:   . Arboriculturist in the Last Year:   Transportation Needs:   . Film/video editor  (Medical):   Marland Kitchen Lack of Transportation (Non-Medical):   Physical Activity:   . Days of Exercise per Week:   . Minutes of Exercise per Session:   Stress:   . Feeling of Stress :   Social Connections:   . Frequency of Communication with Friends and Family:   . Frequency of Social Gatherings with Friends and Family:   . Attends Religious Services:   . Active Member of Clubs or Organizations:   . Attends Archivist Meetings:   Marland Kitchen Marital Status:   Intimate Partner Violence:   . Fear of Current or Ex-Partner:   . Emotionally Abused:   Marland Kitchen Physically Abused:   . Sexually Abused:      PHYSICAL EXAM   Vitals:   08/18/19 1056  BP: (!) 146/82  Pulse: 77  SpO2: 96%  Weight: 164 lb 5 oz (74.5 kg)  Height: 4\' 11"  (1.499 m)   Not recorded     Body mass index is 33.19 kg/m.  PHYSICAL EXAMNIATION:  Gen: NAD, conversant, well nourised, well groomed                     Cardiovascular: Regular rate rhythm, no peripheral edema, warm, nontender. Eyes: Conjunctivae clear without exudates or hemorrhage Neck: Supple, no carotid bruits. Pulmonary: Clear to auscultation bilaterally   NEUROLOGICAL EXAM:  MMSE - Mini Mental State Exam 08/18/2019  Orientation to time 4  Orientation to Place 5  Registration 3  Attention/ Calculation 4  Recall 2  Language- name 2 objects 2  Language- repeat 1  Language- follow 3 step command 3  Language- read & follow direction 1  Write a sentence 1  Copy design 1  Total score 27  animal naming 11.   CRANIAL NERVES: CN II: Visual fields are full to confrontation. Pupils are round equal and briskly reactive to light. CN III, IV, VI: extraocular movement are normal. No ptosis. CN V: Facial sensation is intact to light touch CN VII: Face is symmetric with normal eye closure  CN VIII: Hearing is normal to causal conversation. CN IX, X: Phonation is normal. CN XI: Head turning and shoulder shrug are intact  MOTOR: There is no pronator drift  of out-stretched arms. Muscle bulk and tone are normal. Muscle strength is normal.  REFLEXES: Reflexes are 2+ and symmetric at the biceps, triceps, knees, and ankles. Plantar responses are flexor.  SENSORY: Intact to light touch, pinprick and vibratory sensation are intact in fingers and toes.  COORDINATION: There is no trunk or limb dysmetria noted.  GAIT/STANCE: Posture is normal. Gait is steady with normal steps, base, arm swing, and  turning. Heel and toe walking are normal. Tandem gait is normal.  Romberg is absent.   DIAGNOSTIC DATA (LABS, IMAGING, TESTING) - I reviewed patient records, labs, notes, testing and imaging myself where available.   ASSESSMENT AND PLAN  Ashley Haynes is a 64 y.o. female   Mild cognitive impairment  Aging process versus early stage of central nervous system degenerative disorder  MRI of the brain was normal  Laboratory evaluation showed no treatable etiology  EEG  I have suggested her healthy diet, moderate exercise, she wants to hold off medication at this point   Marcial Pacas, M.D. Ph.D.  [facility] [address] [place], [place] [address] Ph: [phone] Fax: [phone]  CC:  Emeterio Reeve, [facility] [address] [place],   [address]

## 2019-09-07 ENCOUNTER — Ambulatory Visit (INDEPENDENT_AMBULATORY_CARE_PROVIDER_SITE_OTHER): Payer: Commercial Managed Care - PPO | Admitting: Neurology

## 2019-09-07 ENCOUNTER — Other Ambulatory Visit: Payer: Self-pay

## 2019-09-07 DIAGNOSIS — R410 Disorientation, unspecified: Secondary | ICD-10-CM

## 2019-09-10 NOTE — Procedures (Signed)
   HISTORY: 64 year old female presented with memory loss,  TECHNIQUE:  This is a routine 16 channel EEG recording with one channel devoted to a limited EKG recording.  It was performed during wakefulness, drowsiness and asleep.  Hyperventilation and photic stimulation were performed as activating procedures.  There are minimum muscle and movement artifact noted.  Upon maximum arousal, posterior dominant waking rhythm consistent of rhythmic alpha range activity, with frequency of 10 hz. Activities are symmetric over the bilateral posterior derivations and attenuated with eye opening.  Hyperventilation produced mild/moderate buildup with higher amplitude and the slower activities noted.  Photic stimulation did not alter the tracing.  During EEG recording, patient developed drowsiness and no deeper stage of sleep was achieved During EEG recording, there was no epileptiform discharge noted.  EKG demonstrate sinus rhythm, with heart rate of 76 bpm  CONCLUSION: This is a  normal awake EEG.  There is no electrodiagnostic evidence of epileptiform discharge.  Marcial Pacas, M.D. Ph.D.  [facility] [place], [place] [address] Phone: [phone] Fax:      [phone]

## 2019-09-21 ENCOUNTER — Ambulatory Visit: Payer: Commercial Managed Care - PPO | Admitting: Sports Medicine

## 2019-10-07 ENCOUNTER — Ambulatory Visit: Payer: Commercial Managed Care - PPO | Admitting: Sports Medicine

## 2019-10-13 ENCOUNTER — Ambulatory Visit (INDEPENDENT_AMBULATORY_CARE_PROVIDER_SITE_OTHER): Payer: Commercial Managed Care - PPO | Admitting: Sports Medicine

## 2019-10-13 ENCOUNTER — Other Ambulatory Visit: Payer: Self-pay

## 2019-10-13 DIAGNOSIS — G8929 Other chronic pain: Secondary | ICD-10-CM

## 2019-10-13 DIAGNOSIS — M25551 Pain in right hip: Secondary | ICD-10-CM | POA: Diagnosis not present

## 2019-10-13 NOTE — Assessment & Plan Note (Signed)
Ashley Haynes has very mild right hip osteoarthritis, at the last visit we added some Celebrex and she returns today having done really well, she does occasionally work at [employer], and has been 5+ hours on her feet, the next day she certainly feels it but none of this is enough to consider an injection. I am going to add her hip conditioning exercises, continue Celebrex, return as needed.

## 2019-10-13 NOTE — Progress Notes (Signed)
    Procedures performed today:    None.  Independent interpretation of notes and tests performed by another provider:   None.  Brief History, Exam, Impression, and Recommendations:    Chronic right hip pain Daffney has very mild right hip osteoarthritis, at the last visit we added some Celebrex and she returns today having done really well, she does occasionally work at [employer], and has been 5+ hours on her feet, the next day she certainly feels it but none of this is enough to consider an injection. I am going to add her hip conditioning exercises, continue Celebrex, return as needed.    ___________________________________________ Gwen Her. Dianah Field, M.D., ABFM., CAQSM. Primary Care and [place] Instructor of [place] [facility]

## 2020-02-18 ENCOUNTER — Encounter: Payer: Self-pay | Admitting: Neurology

## 2020-02-18 ENCOUNTER — Ambulatory Visit: Payer: Medicare HMO | Admitting: Neurology

## 2020-02-18 VITALS — BP 122/74 | HR 70 | Ht 59.0 in | Wt 140.0 lb

## 2020-02-18 DIAGNOSIS — R410 Disorientation, unspecified: Secondary | ICD-10-CM

## 2020-02-18 NOTE — Progress Notes (Signed)
PATIENT: Ashley Haynes DOB: [birth year]  REASON FOR VISIT: follow up HISTORY FROM: patient  HISTORY OF PRESENT ILLNESS: Today 02/18/20  HISTORY  Ashley Haynes is a 65 year old female, seen in request by her primary care physician Dr. Emeterio Reeve, for evaluation of memory loss, right hand tremor, initial evaluation was on August 18, 2019  I reviewed and summarized the referring note.  Past medical history Hypertension,  She retired from Barista job in 2019, denies difficulty handling her job, has become less active since retirement, she reported sometimes she walked down the hallway, forgot why she was there, after a while, it will come back to her, sometimes she started thought, difficult to carry through.  She denies difficulty handling daily activity, still driving, pickup painting as her new hobby, sleeps well most of the time, but often woke up multiple times at night to use bathroom, sometimes difficulty falling back to sleep,  Father is at age 42, still sharp minded, does a lot of community work, keep up his plumbing license, mother died of diabetes and heart disease  Personally reviewed MRI of the brain March 2021 that was normal Laboratory evaluation February 2021: A1c 5.9, no significant abnormality on CBC, hemoglobin 15, CMP, LDL was elevated 143, normal TSH 1.3  Update February 18, 2020 SS: EEG was normal. MMSE 29/30 today. Here with husband, he notices short term memory trouble, anything within the last week or so. Ex: forget where her phone is, glasses, cooking something can't remember the recipe. Keep calendar of appointments, remembers medications, "have been same forever". Drives well, once got lost in [facility] [place] but turned on GPS and got home. Memory troubles started at time of retirement in 2019. Denies mood issues, no depression. Previously tested for OSA, was negative, she does snore, thinks 10 years ago.   REVIEW OF SYSTEMS: Out  of a complete 14 system review of symptoms, the patient complains only of the following symptoms, and all other reviewed systems are negative.  Memory loss  ALLERGIES: No Known Allergies  HOME MEDICATIONS: Outpatient Medications Prior to Visit  Medication Sig Dispense Refill  . atorvastatin (LIPITOR) 40 MG tablet TAKE 1 TABLET BY MOUTH EVERY DAY 90 tablet 2  . CALCIUM PO Take by mouth.    . celecoxib (CELEBREX) 200 MG capsule One to 2 tablets by mouth daily as needed for pain. 60 capsule 2  . Cholecalciferol (VITAMIN D3 PO) Take by mouth.    . Flaxseed, Linseed, (FLAX SEEDS PO) Take by mouth.    Marland Kitchen GARLIC PO Take by mouth.    . latanoprost (XALATAN) 0.005 % ophthalmic solution INSTILL 1 DROP INTO BOTH EYES IN THE EVENING    . lisinopril (ZESTRIL) 30 MG tablet TAKE 1 TABLET BY MOUTH EVERY DAY 90 tablet 3  . MAGNESIUM PO Take by mouth.    . Multiple Vitamin (MULTIVITAMIN) tablet Take 1 tablet by mouth daily.    Marland Kitchen NIACIN PO Take by mouth.    . Omega-3 Fatty Acids (FISH OIL PO) Take by mouth.    Marland Kitchen VITAMIN E PO Take by mouth.     No facility-administered medications prior to visit.    PAST MEDICAL HISTORY: Past Medical History:  Diagnosis Date  . Cardiac risk counseling 03/11/2015   Ten-year ASCVD risk 15.1% (calculated 03/11/2015) given hyperlipidemia, moderately well-controlled blood pressure, age. Prediabetes is also a concern, as well as increased BMI  . Former smoker 11/30/2014   Quit 09/1988  . Hyperlipidemia   .  Hypertension   . Menopause age 33    PAST SURGICAL HISTORY: Past Surgical History:  Procedure Laterality Date  . CESAREAN SECTION    . COLONOSCOPY  07/04/2006  . ltcs      FAMILY HISTORY: Family History  Problem Relation Age of Onset  . Heart disease Mother 43       MI  . Diabetes Mother   . Glaucoma Mother   . Hyperlipidemia Sister   . Colon cancer Neg Hx   . Colon polyps Neg Hx   . Esophageal cancer Neg Hx   . Rectal cancer Neg Hx   . Stomach cancer  Neg Hx     SOCIAL HISTORY: Social History   Socioeconomic History  . Marital status: Married    Spouse name: Not on file  . Number of children: Not on file  . Years of education: Not on file  . Highest education level: Not on file  Occupational History  . Not on file  Tobacco Use  . Smoking status: Never Smoker  . Smokeless tobacco: Never Used  Vaping Use  . Vaping Use: Never used  Substance and Sexual Activity  . Alcohol use: No  . Drug use: No  . Sexual activity: Not Currently  Other Topics Concern  . Not on file  Social History Narrative  . Not on file   Social Determinants of Health   Financial Resource Strain: Not on file  Food Insecurity: Not on file  Transportation Needs: Not on file  Physical Activity: Not on file  Stress: Not on file  Social Connections: Not on file  Intimate Partner Violence: Not on file   PHYSICAL EXAM  Vitals:   02/18/20 0951  BP: 122/74  Pulse: 70  Weight: 140 lb (63.5 kg)  Height: 4\' 11"  (1.499 m)   Body mass index is 28.28 kg/m.  Generalized: Well developed, in no acute distress  MMSE - Mini Mental State Exam 02/18/2020 08/18/2019  Orientation to time 5 4  Orientation to Place 5 5  Registration 3 3  Attention/ Calculation 5 4  Recall 2 2  Language- name 2 objects 2 2  Language- repeat 1 1  Language- follow 3 step command 3 3  Language- read & follow direction 1 1  Write a sentence 1 1  Copy design 1 1  Total score 29 27    Neurological examination  Mentation: Alert oriented to time, place, history taking. Follows all commands speech and language fluent Cranial nerve II-XII: Pupils were equal round reactive to light. Extraocular movements were full, visual field were full on confrontational test. Facial sensation and strength were normal.  Head turning and shoulder shrug  were normal and symmetric. Motor: The motor testing reveals 5 over 5 strength of all 4 extremities. Good symmetric motor tone is noted throughout.   Sensory: Sensory testing is intact to soft touch on all 4 extremities. No evidence of extinction is noted.  Coordination: Cerebellar testing reveals good finger-nose-finger and heel-to-shin bilaterally.  Gait and station: Gait is normal.  Reflexes: Deep tendon reflexes are symmetric and normal bilaterally.   DIAGNOSTIC DATA (LABS, IMAGING, TESTING) - I reviewed patient records, labs, notes, testing and imaging myself where available.  Lab Results  Component Value Date   WBC 4.2 03/04/2019   HGB 15.0 03/04/2019   HCT 44.8 03/04/2019   MCV 84.4 03/04/2019   PLT 275 03/04/2019      Component Value Date/Time   NA 136 03/04/2019 0913   K 4.8  03/04/2019 0913   CL 100 03/04/2019 0913   CO2 30 03/04/2019 0913   GLUCOSE 99 03/04/2019 0913   BUN 15 03/04/2019 0913   CREATININE 0.86 03/04/2019 0913   CALCIUM 10.2 03/04/2019 0913   PROT 7.8 03/04/2019 0913   ALBUMIN 4.1 11/30/2014 0913   AST 21 03/04/2019 0913   ALT 19 03/04/2019 0913   ALKPHOS 69 11/30/2014 0913   BILITOT 0.4 03/04/2019 0913   GFRNONAA 71 03/04/2019 0913   GFRAA 83 03/04/2019 0913   Lab Results  Component Value Date   CHOL 221 (H) 03/04/2019   HDL 58 03/04/2019   LDLCALC 143 (H) 03/04/2019   TRIG 95 03/04/2019   CHOLHDL 3.8 03/04/2019   Lab Results  Component Value Date   HGBA1C 5.9 (H) 03/04/2019   Lab Results  Component Value Date   VITAMINB12 547 03/04/2019   Lab Results  Component Value Date   TSH 1.30 03/04/2019   ASSESSMENT AND PLAN 65 y.o. year old female  has a past medical history of Cardiac risk counseling (03/11/2015), Former smoker (11/30/2014), Hyperlipidemia, Hypertension, and Menopause (age 71). here with:  1. Mild cognitive impairment -Aging process vs early stage central nervous system degenerative disorder -MRI of the brain was normal -Laboratory evaluation showed no treatable etiology -EEG was normal Aug 2021 -Referral for neuropsychological evaluation, concerns for future/plans  going forward -They wish to hold memory medications for now -Would like to revisit getting sleep evaluation repeat, possible OSA, but not likely to explain all memory issues -Recommend healthy lifestyle, moderate exercise  I spent 30 minutes of face-to-face and non-face-to-face time with patient.  This included previsit chart review, lab review, study review, order entry, electronic health record documentation, patient education.  Butler Denmark, AGNP-C, DNP 02/18/2020, 9:56 AM [facility] [address], [place], [place] [address] [phone]

## 2020-02-18 NOTE — Patient Instructions (Signed)
Send for neuropsych evaluation  See you back 4-5 months

## 2020-02-23 ENCOUNTER — Encounter: Payer: Self-pay | Admitting: Psychology

## 2020-03-10 ENCOUNTER — Other Ambulatory Visit: Payer: Self-pay | Admitting: Osteopathic Medicine

## 2020-03-10 DIAGNOSIS — E785 Hyperlipidemia, unspecified: Secondary | ICD-10-CM

## 2020-03-30 ENCOUNTER — Other Ambulatory Visit: Payer: Self-pay

## 2020-03-30 DIAGNOSIS — I10 Essential (primary) hypertension: Secondary | ICD-10-CM

## 2020-03-30 DIAGNOSIS — E785 Hyperlipidemia, unspecified: Secondary | ICD-10-CM

## 2020-03-30 MED ORDER — LISINOPRIL 30 MG PO TABS: 30.0000 mg | ORAL_TABLET | Freq: Every day | ORAL | 1 refills | Status: DC

## 2020-03-30 MED ORDER — ATORVASTATIN CALCIUM 40 MG PO TABS: 40.0000 mg | ORAL_TABLET | Freq: Every day | ORAL | 1 refills | Status: DC

## 2020-04-01 ENCOUNTER — Other Ambulatory Visit: Payer: Self-pay

## 2020-04-01 ENCOUNTER — Telehealth: Payer: Self-pay

## 2020-04-01 DIAGNOSIS — E785 Hyperlipidemia, unspecified: Secondary | ICD-10-CM

## 2020-04-01 MED ORDER — GLUCOSE BLOOD VI STRP: ORAL_STRIP | 99 refills | Status: DC

## 2020-04-01 MED ORDER — ONETOUCH DELICA LANCETS 33G MISC: 99 refills | Status: DC

## 2020-04-01 MED ORDER — FREESTYLE SYSTEM KIT: PACK | 99 refills | Status: DC

## 2020-04-01 NOTE — Telephone Encounter (Signed)
[place] is requesting new rxs for [employer] True Metrix test strips, True Metrix blood glucose meter, Trueplus Ultra thin 30g lance.

## 2020-04-01 NOTE — Progress Notes (Signed)
Opened in error

## 2020-04-07 ENCOUNTER — Encounter: Payer: Self-pay | Admitting: Psychology

## 2020-04-07 ENCOUNTER — Ambulatory Visit (INDEPENDENT_AMBULATORY_CARE_PROVIDER_SITE_OTHER): Payer: Medicare HMO | Admitting: Psychology

## 2020-04-07 ENCOUNTER — Other Ambulatory Visit: Payer: Self-pay

## 2020-04-07 ENCOUNTER — Ambulatory Visit: Payer: Medicare HMO | Admitting: Psychology

## 2020-04-07 DIAGNOSIS — R4189 Other symptoms and signs involving cognitive functions and awareness: Secondary | ICD-10-CM

## 2020-04-07 DIAGNOSIS — G3184 Mild cognitive impairment, so stated: Secondary | ICD-10-CM | POA: Insufficient documentation

## 2020-04-07 DIAGNOSIS — G309 Alzheimer's disease, unspecified: Secondary | ICD-10-CM | POA: Insufficient documentation

## 2020-04-07 HISTORY — DX: Mild cognitive impairment of uncertain or unknown etiology: G31.84

## 2020-04-07 NOTE — Progress Notes (Addendum)
NEUROPSYCHOLOGICAL EVALUATION [place]. [facility] Department of Neurology  Date of Evaluation: April 07, 2020  Reason for Referral:   Ashley Haynes is a 65 y.o. right-handed African-American female referred by Butler Denmark, NP, to characterize her current cognitive functioning and assist with diagnostic clarity and treatment planning in the context of subjective cognitive decline.   Assessment and Plan:   Clinical Impression(s): Scores across stand-alone and embedded performance validity measures were variable. Because of this, the results of the current evaluation should be interpreted with caution and low scores have the potential to underestimate true cognitive abilities.  If taken at face value, Ashley Haynes's pattern of performance is suggestive of primary deficits surrounding encoding (i.e., learning) and retrieval aspects of memory. Retention rates were variable (ranging from 0 to 125%) while performance on yes/no recognition trials was consistently below expectation. She also performed poorly across a computerized task assessing hypothesis testing and adaptability. However, other aspects of executive functioning were within normal limits relative to age-matched peers. Additionally, performance was also appropriate across processing speed, attention/concentration, receptive and expressive language, and visuospatial abilities. Ashley Haynes denied difficulties completing instrumental activities of daily living (ADLs) independently. Overall, if test results are viewed as valid, results would suggest that Ashley Haynes best fits diagnostic criteria for a Mild Neurocognitive Disorder ("mild cognitive impairment") at the present time.  The etiology of exhibited cognitive deficits is unclear, in part due to concerns surrounding testing validity. Speaking broadly, if an underlying neurodegenerative condition is assumed, Alzheimer's disease would be most likely given population base  rates. Ashley Haynes does display memory deficits with variable retention rates and poor performance across recognition trials which can be seen early on in this condition. However, she is somewhat young relative to the typical age of onset and performances across other cognitive domains commonly implicated early on in this disease process remained quite strong. The presence of tremors or potential involuntary movements in her right hand would also raise the possibility of something along the parkinsonian spectrum. While I cannot rule out an underlying neurodegenerative condition, there is certainly not enough evidence to strongly suggest the presence of one at the present time, especially as memory functioning can be influenced by numerous other factors (e.g., psychosocial stressors, sleep disturbances, vitamin imbalances, etc.). Behavioral characteristics were not present to warrant significant concerns surrounding Lewy body or frontotemporal dementia at the present time. It is likely that time needs to pass and additional dysfunction occur before we are able to get better information surrounding diagnostic clarity. As such, continued medical monitoring will be important moving forward.   Recommendations: It appears that several labs were run in February 2021. Ashley Haynes and her medical team should ensure that labs are monitored and could consider additional labs to check for vitamin or hormonal imbalances which could affect cognitive abilities.  Ashley Haynes should discuss ways to improve her sleep with her medical team. She would likely also benefit from the introduction of various sleep hygiene practices, as well as fluid restriction before bedtime. There is mention in Ashley Haynes's notes surrounding contemplation of an updated sleep study. Given snoring behaviors, I would be in favor of this.   If members of her medical team have stronger beliefs surrounding a disorder on the parkinsonian spectrum given  ongoing tremors, a DaTscan could be useful.   A repeat neuropsychological evaluation in 24-36 months (or sooner if functional decline is noted) is recommended to assess the trajectory of future cognitive decline should it  occur. This will also aid in future efforts towards improved diagnostic clarity.  Ashley Haynes is encouraged to attend to lifestyle factors for brain health (e.g., regular physical exercise, good nutrition habits, regular participation in cognitively-stimulating activities, and general stress management techniques), which are likely to have benefits for both emotional adjustment and cognition. In fact, in addition to promoting good general health, regular exercise incorporating aerobic activities (e.g., brisk walking, jogging, cycling, etc.) has been demonstrated to be a very effective treatment for depression and stress, with similar efficacy rates to both antidepressant medication and psychotherapy. Optimal control of vascular risk factors (including safe cardiovascular exercise and adherence to dietary recommendations) is encouraged.   When learning new information, she would benefit from information being broken up into small, manageable pieces. She may also find it helpful to articulate the material in her own words and in a context to promote encoding at the onset of a new task. This material may need to be repeated multiple times to promote encoding.  Memory can be improved using internal strategies such as rehearsal, repetition, chunking, mnemonics, association, and imagery. External strategies such as written notes in a consistently used memory journal, visual and nonverbal auditory cues such as a calendar on the refrigerator or appointments with alarm, such as on a cell phone, can also help maximize recall.    To address problems with fluctuating attention, she may wish to consider:   -Avoiding external distractions when needing to concentrate   -Limiting exposure to fast paced  environments with multiple sensory demands   -Writing down complicated information and using checklists   -Attempting and completing one task at a time (i.e., no multi-tasking)    -Verbalizing aloud each step of a task to maintain focus   -Reducing the amount of information considered at one time  Review of Records:   Ashley Haynes was seen by [facility] Butler Denmark, NP) on 02/18/2020 for an evaluation of memory loss and a right hand tremor. Specific to the former, she described sometimes walking down the hallway and forgetting why she was there. However, after a while, information generally comes back to her. She also reported misplacing things at times and having trouble recalling recipes. She keeps a calendar of appointments. She reportedly drives well. She once got lost in downtown [place]; however, she was able to utilize her GPS and got home without incident. Memory dysfunction were said to start around the time of her retirement in 2019. She denied that her retirement was due to difficulties completing job responsibilities. She noted some trouble with sleep, stating that she will wake up multiple times to use the restroom and then may have trouble falling back asleep. Performance on a brief cognitive screening instrument (MMSE) was 29/30. Ultimately, Ashley Haynes was referred for a comprehensive neuropsychological evaluation to characterize her cognitive abilities and to assist with diagnostic clarity and treatment planning.   February 2021 labs: A1c 5.9, no significant abnormality on CBC, hemoglobin 15, CMP, LDL was elevated 143, normal TSH 1.3.  Brain MRI on 03/16/2019 was unremarkable. Per Dr. Rhea Haynes notes, a prior EEG was also said to be normal.   Past Medical History:  Diagnosis Date  . Anemia, unspecified 04/29/2006  . Benign neoplasm of skin 04/25/2007  . Cardiac risk counseling 03/11/2015   Ten-year ASCVD risk 15.1% (calculated 03/11/2015) given hyperlipidemia,  moderately well-controlled blood pressure, age. Prediabetes is also a concern, as well as increased BMI  . Chronic right hip pain 08/18/2019  . Essential hypertension,  benign 08/17/2010  . Former smoker    Quit 09/1988  . Hyperlipidemia   . Menopause    age 15  . Prediabetes 03/08/2015    Past Surgical History:  Procedure Laterality Date  . CESAREAN SECTION    . COLONOSCOPY  07/04/2006  . [facility]      Current Outpatient Medications:  .  atorvastatin (LIPITOR) 40 MG tablet, Take 1 tablet (40 mg total) by mouth daily., Disp: 90 tablet, Rfl: 1 .  CALCIUM PO, Take by mouth., Disp: , Rfl:  .  celecoxib (CELEBREX) 200 MG capsule, One to 2 tablets by mouth daily as needed for pain., Disp: 60 capsule, Rfl: 2 .  Cholecalciferol (VITAMIN D3 PO), Take by mouth., Disp: , Rfl:  .  Flaxseed, Linseed, (FLAX SEEDS PO), Take by mouth., Disp: , Rfl:  .  GARLIC PO, Take by mouth., Disp: , Rfl:  .  glucose blood test strip, Use up to 4 times per day as directed with glucometer. Disp: 100. Refill x99, Disp: 100 strip, Rfl: 99 .  glucose monitoring kit (FREESTYLE) monitoring kit, GLUCOMETER PER INSURANCE PREFERENCE - SUBSTITUTION PERMITTED, Disp: 1 each, Rfl: 99 .  latanoprost (XALATAN) 0.005 % ophthalmic solution, INSTILL 1 DROP INTO BOTH EYES IN THE EVENING, Disp: , Rfl:  .  lisinopril (ZESTRIL) 30 MG tablet, Take 1 tablet (30 mg total) by mouth daily., Disp: 90 tablet, Rfl: 1 .  MAGNESIUM PO, Take by mouth., Disp: , Rfl:  .  Multiple Vitamin (MULTIVITAMIN) tablet, Take 1 tablet by mouth daily., Disp: , Rfl:  .  NIACIN PO, Take by mouth., Disp: , Rfl:  .  Omega-3 Fatty Acids (FISH OIL PO), Take by mouth., Disp: , Rfl:  .  OneTouch Delica Lancets 30Q MISC, As directed up to qid SUBSTITUTION PERMITTED, Disp: 657 each, Rfl: 99 .  VITAMIN E PO, Take by mouth., Disp: , Rfl:   Clinical Interview:   The following information was obtained during a clinical interview with Ashley Haynes prior to cognitive  testing.  Cognitive Symptoms: Decreased short-term memory: Endorsed. She reported trouble recalling the details of previous conversations. However, information was said to generally come to her with time or with the provision of some sort of reminder. Medical records also suggest some concerns surrounding her misplacing objects or entering locations and forgetting her original intention. Overall, she reported feeling as though she could not "trust [her] memory" any longer. Difficulties were said to be present approximately two years ago, generally coinciding with her retirement. These were said to have gradually worsened over time.  Decreased long-term memory: Denied. Decreased attention/concentration: Denied. In fact, she reported increased focus due to perceived memory dysfunction.  Reduced processing speed: Denied. Difficulties with executive functions: Denied. She reported utilizing a comprehensive color-coded calendar to help with appointment reminders with good success. She denied trouble with indecision or impulsivity. Personality changes were also denied.  Difficulties with emotion regulation: Denied. Difficulties with receptive language: Denied. Difficulties with word finding: Denied. Decreased visuoperceptual ability: Denied.  Difficulties completing ADLs: Denied. She manages her medications and reported driving without noted difficulty. Her husband manages finances, which is somewhat longstanding in nature.   Additional Medical History: History of traumatic brain injury/concussion: Denied. History of stroke: Denied. History of seizure activity: Denied. History of known exposure to toxins: Denied. Symptoms of chronic pain: Unclear. She reported occasionally waking up experiencing left arm pain; however, she theorized that this may be due to her sleeping on her side. No other consistent pain symptoms  were reported. However, medical records do suggest a history of chronic right hip pain.   Experience of frequent headaches/migraines: Denied. Frequent instances of dizziness/vertigo: Denied.  Sensory changes: She wears glasses with positive effect and noted some very mild hearing difficulties only when there is significant background noise. Other sensory changes/difficulties (e.g., taste or smell) were denied.  Balance/coordination difficulties: Denied. She also denied any recent falls.  Other motor difficulties: Endorsed. She reported a mild tremor affecting her right hand. Symptoms were only said to be present while attempting to write and she described her handwriting as far sloppier than in the past. She denied experiencing tremor symptoms while performing other fine motor actions like holding a cup of coffee. These were said to be present for the past year. She was unsure if they have worsened or not as she actively avoids writing, favoring dictation or other methods.  Sleep History: Estimated hours obtained each night: Unclear. Ashley Haynes was unable to provide a numerical estimation given difficulties staying asleep throughout the night.  Difficulties falling asleep: Denied. Difficulties staying asleep: Endorsed. She reported waking to use the restroom several times throughout the night. She also described difficulties falling back asleep after being awoken.  Feels rested and refreshed upon awakening: "Usually yes but not always."   History of snoring: Endorsed.  History of waking up gasping for air: Denied. Witnessed breath cessation while asleep: Denied.  History of vivid dreaming: Denied. Excessive movement while asleep: Denied. Instances of acting out her dreams: Denied.  Psychiatric/Behavioral Health History: Depression: She described her current mood as "up and down" but that she is "usually a very jovial person." Down periods were indirectly attributed to various psychosocial stressors. She denied to her knowledge ever being diagnosed with a mental health condition in  the past. Current or remote suicidal ideation, intent, or plan was denied.  Anxiety: Denied. Mania: Denied. Trauma History: Denied. Visual/auditory hallucinations: Denied. Delusional thoughts: Denied.  Tobacco: Denied. Alcohol: She reported occasional but infrequent alcohol consumption, generally with dinner or in social settings. She denied a history of problematic alcohol abuse or dependence.  Recreational drugs: Denied. Caffeine: 12-16 ounces of coffee in the morning.   Family History: Problem Relation Age of Onset  . Heart disease Mother 87       MI  . Diabetes Mother   . Glaucoma Mother   . Hyperlipidemia Sister   . Colon cancer Neg Hx   . Colon polyps Neg Hx   . Esophageal cancer Neg Hx   . Rectal cancer Neg Hx   . Stomach cancer Neg Hx    This information was confirmed by Ashley Haynes. Eulas Post.  Academic/Vocational History: Highest level of educational attainment: 15 years. She graduated from high school and completed three additional years of college. She described herself as a strong (A/B) student in academic settings. No relative weaknesses during early educational settings were identified.  History of developmental delay: Denied. History of grade repetition: Denied. Enrollment in special education courses: Denied. History of LD/ADHD: Denied.  Employment: Retied. 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   Evaluation Results:   Behavioral Observations: Ashley Haynes was unaccompanied, arrived to her appointment on time, and was appropriately dressed and groomed. She appeared alert and oriented. Observed gait and station were within normal limits. Gross motor functioning appeared intact upon informal observation and no abnormal movements (e.g., tremors) were noted. Her affect was generally relaxed and positive. However, she did appear to sit on her hands or otherwise  wring her hands often and it was unclear if this was normal or perhaps represented some  underlying anxiety. Spontaneous speech was fluent and word finding difficulties were not observed during the clinical interview. Thought processes were coherent, organized, and normal in content. Insight into her cognitive difficulties appeared adequate.   During testing, generally mild motor dysfunction was apparent. Tremors were exhibited across motorized tasks. However, there were also instances where Ashley Haynes appeared to exhibit jerking, twitching, slight involuntary movements. This was particularly noticed across TMT A, WAIS-IV Coding, and Figure Drawing tasks. Sustained attention was appropriate. Task engagement was adequate and she persisted when challenged. Overall, Ashley Haynes was cooperative with the clinical interview and subsequent testing procedures.   Adequacy of Effort: The validity of neuropsychological testing is limited by the extent to which the individual being tested may be assumed to have exerted adequate effort during testing. Ashley Haynes expressed her intention to perform to the best of her abilities and exhibited adequate task engagement and persistence. Scores across stand-alone and embedded performance validity measures were variable. Because of this, the results of the current evaluation should be interpreted with caution and low scores have the potential to underestimate true cognitive abilities.  Test Results: Ashley Haynes was largely oriented at the time of the current evaluation. However, she did incorrect state her age 65"). She was also one day off when providing the current date.   Intellectual abilities based upon educational and vocational attainment were estimated to be in the average range. Premorbid abilities were estimated to be within the average range based upon a single-word reading test.   Processing speed was average. Basic attention was average. More complex attention (e.g., working memory) was below average. Executive functioning was variable, generally  ranging from the below average to well above average normative ranges. She did exhibit a consistent weakness on a computerized task assessing hypothesis testing and adaptability with scores generally in the exceptionally low to well below average ranges.  Assessed receptive language abilities were above average. Likewise, Ashley Haynes. Bodner did not exhibit any difficulties comprehending task instructions and answered all questions asked of her appropriately. Assessed expressive language (e.g., verbal fluency and confrontation naming) was below average to average.     Assessed visuospatial/visuoconstructional abilities were average to above average.    Learning (i.e., encoding) of novel verbal and visual information was exceptionally low to well below average. Spontaneous delayed recall (i.e., retrieval) of previously learned information was average across a shape recall task but exceptionally low to well below average across all verbal measures. Retention rates were 90% across a story learning task, 0% across a list learning task, and 125% across a shape learning task. Performance across recognition tasks was exceptionally low to well below average, suggesting limited evidence for information consolidation.   Results of emotional screening instruments suggested that recent symptoms of generalized anxiety were in the minimal range, while symptoms of depression were within normal limits. A screening instrument assessing recent sleep quality suggested the presence of minimal sleep dysfunction.  Tables of Scores:   Note: This summary of test scores accompanies the interpretive report and should not be considered in isolation without reference to the appropriate sections in the text. Descriptors are based on appropriate normative data and may be adjusted based on clinical judgment. The terms "impaired" and "within normal limits (WNL)" are used when a more specific level of functioning cannot be determined.        Effort Testing:   DESCRIPTOR  ACS Word Choice: --- --- Below Expectation  Dot Counting Test: --- --- Within Expectation  NAB EVI: --- --- Below Expectation  D-KEFS Color Word Effort Index: --- --- Within Expectation       Orientation:      Raw Score Percentile   NAB Orientation, Form 1 27/29 --- ---       Cognitive Screening:           Raw Score Percentile   SLUMS: 20/30 --- ---       Intellectual Functioning:           Standard Score Percentile   Test of Premorbid Functioning: 100 50 Average       Memory:          NAB Memory Module, Form 1: T Score Percentile   List Learning       Total Trials 1-3 16/36 (35) 7 Well Below Average    List B 4/12 (48) 42 Average    Short Delay Free Recall 2/12 (25) 1 Exceptionally Low    Long Delay Free Recall 0/12 (24) <1 Exceptionally Low    Retention Percentage 0 (15) <1 Exceptionally Low    Recognition Discriminability -3 (20) <1 Exceptionally Low  Shape Learning       Total Trials 1-3 10/27 (34) 5 Well Below Average    Delayed Recall 5/9 (47) 38 Average    Retention Percentage 125 (59) 82 Above Average    Recognition Discriminability 3 (32) 4 Well Below Average  Story Learning       Immediate Recall 35/80 (28) 2 Exceptionally Low    Delayed Recall 18/40 (30) 2 Well Below Average    Retention Percentage 90 (50) 50 Average  Daily Living Memory       Immediate Recall 32/51 (35) 7 Well Below Average    Delayed Recall 5/17 (19) <1 Exceptionally Low    Retention Percentage 33 (13) <1 Exceptionally Low    Recognition Hits 6/10 (28) 2 Exceptionally Low       Attention/Executive Function:          Trail Making Test (TMT): Raw Score (T Score) Percentile     Part A 34 secs.,  1 error (52) 58 Average    Part B 61 secs.,  1 error (66) 95 Well Above Average         Scaled Score Percentile   WAIS-IV Coding: 8 25 Average       NAB Attention Module, Form 1: T Score Percentile     Digits Forward 43 25 Average    Digits Backwards 37  9 Below Average       D-KEFS Color-Word Interference Test: Raw Score (Scaled Score) Percentile     Color Naming 37 secs. (8) 25 Average    Word Reading 27 secs. (9) 37 Average    Inhibition 66 secs. (11) 63 Average      Total Errors 1 error (11) 63 Average    Inhibition/Switching 100 secs. (7) 16 Below Average      Total Errors 3 errors (10) 50 Average       D-KEFS Verbal Fluency Test: Raw Score (Scaled Score) Percentile     Letter Total Correct 30 (8) 25 Average    Category Total Correct 28 (7) 16 Below Average    Category Switching Total Correct 9 (6) 9 Below Average    Category Switching Accuracy 8 (7) 16 Below Average      Total Set Loss Errors 3 (9) 37 Average  Total Repetition Errors 1 (12) 75 Above Average       D-KEFS 20 Questions Test: Scaled Score Percentile     Total Weighted Achievement Score 12 75 Above Average    Initial Abstraction Score 11 63 Average       [place]consin Card Sorting Test: Raw Score Percentile     Categories (trials) 1 (64) 6-10 Well Below Average    Total Errors 33 4 Well Below Average    Perseverative Errors 13 18 Below Average    Non-Perseverative Errors 20 2 Exceptionally Low    Failure to Maintain Set 0 --- ---       Language:          Verbal Fluency Test: Raw Score (T Score) Percentile     Phonemic Fluency (FAS) 30 (48) 42 Average    Animal Fluency 14 (47) 38 Average        NAB Language Module, Form 1: T Score Percentile     Auditory Comprehension 58 79 Above Average    Naming 30/31 (56) 73 Average       Visuospatial/Visuoconstruction:      Raw Score Percentile   Clock Drawing: 10/10 --- Within Normal Limits       NAB Spatial Module, Form 1: T Score Percentile     Figure Drawing Copy 48 42 Average    Figure Drawing Immediate Recall 40 16 Below Average        Scaled Score Percentile   WAIS-IV Block Design: 12 75 Above Average  WAIS-IV Matrix Reasoning: 11 63 Average       Mood and Personality:      Raw Score Percentile    Beck Depression Inventory - II: 9 --- Within Normal Limits  PROMIS Anxiety Questionnaire: 15 --- None to Slight       Additional Questionnaires:      Raw Score Percentile   PROMIS Sleep Disturbance Questionnaire: 23 --- None to Slight   Informed Consent and Coding/Compliance:   The current evaluation represents a clinical evaluation for the purposes previously outlined by the referral source and is in no way reflective of a forensic evaluation.   Ashley Haynes. Tomassetti was provided with a verbal description of the nature and purpose of the present neuropsychological evaluation. Also reviewed were the foreseeable risks and/or discomforts and benefits of the procedure, limits of confidentiality, and mandatory reporting requirements of this provider. The patient was given the opportunity to ask questions and receive answers about the evaluation. Oral consent to participate was provided by the patient.   This evaluation was conducted by Christia Reading, Ph.D., licensed clinical neuropsychologist. Ashley Haynes. Biss completed a clinical interview with Dr. Melvyn Novas, billed as one unit [phone], and 165 minutes of cognitive testing and scoring, billed as one unit [phone] and five additional units [phone]. Psychometrist Milana Kidney, B.S., assisted Dr. Melvyn Novas with test administration and scoring procedures. As a separate and discrete service, Dr. Melvyn Novas spent a total of 120 minutes in interpretation and report writing billed as one unit [phone] and one unit [phone].

## 2020-04-07 NOTE — Progress Notes (Signed)
   Psychometrician Note   Cognitive testing was administered to Ashley Haynes by Milana Kidney, B.S. (psychometrist) under the supervision of Dr. Christia Reading, Ph.D., licensed psychologist on 04/07/20. Ashley Haynes did not appear overtly distressed by the testing session per behavioral observation or responses across self-report questionnaires. Rest breaks were offered.    The battery of tests administered was selected by Dr. Christia Reading, Ph.D. with consideration to Ashley Haynes's current level of functioning, the nature of her symptoms, emotional and behavioral responses during interview, level of literacy, observed level of motivation/effort, and the nature of the referral question. This battery was communicated to the psychometrist. Communication between Dr. Christia Reading, Ph.D. and the psychometrist was ongoing throughout the evaluation and Dr. Christia Reading, Ph.D. was immediately accessible at all times. Dr. Christia Reading, Ph.D. provided supervision to the psychometrist on the date of this service to the extent necessary to assure the quality of all services provided.    Ashley Haynes will return within approximately 1-2 weeks for an interactive feedback session with Dr. Melvyn Novas at which time her test performances, clinical impressions, and treatment recommendations will be reviewed in detail. Ashley Haynes understands she can contact our office should she require our assistance before this time.  A total of 165 minutes of billable time were spent face-to-face with Ashley Haynes by the psychometrist. This includes both test administration and scoring time. Billing for these services is reflected in the clinical report generated by Dr. Christia Reading, Ph.D.  This note reflects time spent with the psychometrician and does not include test scores or any clinical interpretations made by Dr. Melvyn Novas. The full report will follow in a separate note.

## 2020-04-14 ENCOUNTER — Other Ambulatory Visit: Payer: Self-pay

## 2020-04-14 ENCOUNTER — Ambulatory Visit (INDEPENDENT_AMBULATORY_CARE_PROVIDER_SITE_OTHER): Payer: Medicare HMO | Admitting: Psychology

## 2020-04-14 DIAGNOSIS — R413 Other amnesia: Secondary | ICD-10-CM | POA: Diagnosis not present

## 2020-04-14 NOTE — Progress Notes (Signed)
   Neuropsychology Feedback Session Ashley Haynes. [facility]  Reason for Referral:   EXA BOMBA a 65 y.o. right-handed African-American female referred by Butler Denmark, NP,to characterize hercurrent cognitive functioning and assist with diagnostic clarity and treatment planning in the context of subjective cognitive decline.   Feedback:   [name] completed a comprehensive neuropsychological evaluation on 04/07/2020. Please refer to that encounter for the full report and recommendations. Briefly, results suggested primary deficits surrounding encoding (i.e., learning) and retrieval aspects of memory. Retention rates were variable (ranging from 0 to 125%) while performance on yes/no recognition trials was consistently below expectation. She also performed poorly across a computerized task assessing hypothesis testing and adaptability. However, other aspects of executive functioning were within normal limits relative to age-matched peers. Speaking broadly, if an underlying neurodegenerative condition is assumed, Alzheimer's disease would be most likely given population base rates. [name] does display memory deficits with variable retention rates and poor performance across recognition trials which can be seen early on in this condition. However, she is somewhat young relative to the typical age of onset and performances across other cognitive domains commonly implicated early on in this disease process remained quite strong. The presence of tremors or potential involuntary movements in her right hand would also raise the possibility of something along the parkinsonian spectrum. While I cannot rule out an underlying neurodegenerative condition, there is certainly not enough evidence to strongly suggest the presence of one at the present time, especially as memory functioning can be influenced by numerous other factors (e.g., psychosocial stressors, sleep  disturbances, vitamin imbalances, etc.).  [name] was accompanied by her husband during the current feedback session. Content of the current session focused on the results of her neuropsychological evaluation. [name] and her husband were given the opportunity to ask questions and their questions were answered. They were encouraged to reach out should additional questions arise. A copy of her report was provided at the conclusion of the visit.      20 minutes were spent conducting the current feedback session with [name], billed as one unit [phone].

## 2020-06-05 ENCOUNTER — Other Ambulatory Visit: Payer: Self-pay | Admitting: Osteopathic Medicine

## 2020-06-05 DIAGNOSIS — E785 Hyperlipidemia, unspecified: Secondary | ICD-10-CM

## 2020-06-12 ENCOUNTER — Other Ambulatory Visit: Payer: Self-pay | Admitting: Osteopathic Medicine

## 2020-06-12 DIAGNOSIS — I10 Essential (primary) hypertension: Secondary | ICD-10-CM

## 2020-07-07 ENCOUNTER — Other Ambulatory Visit: Payer: Self-pay

## 2020-07-07 ENCOUNTER — Encounter: Payer: Self-pay | Admitting: Neurology

## 2020-07-07 ENCOUNTER — Ambulatory Visit: Payer: Medicare HMO | Admitting: Neurology

## 2020-07-07 DIAGNOSIS — G3184 Mild cognitive impairment, so stated: Secondary | ICD-10-CM

## 2020-07-07 HISTORY — DX: Mild cognitive impairment of uncertain or unknown etiology: G31.84

## 2020-07-07 NOTE — Patient Instructions (Signed)
See you back in 6 months  Recommend starting some exercises

## 2020-07-07 NOTE — Progress Notes (Signed)
PATIENT: Ashley Haynes DOB: Mar 25, 1955  REASON FOR VISIT: follow up HISTORY FROM: patient Primary Neurologist: Dr. Krista Blue  HISTORY  PERSEPHONE SCHRIEVER is a 65 year old female, seen in request by her primary care physician Dr. Emeterio Reeve, for evaluation of memory loss, right hand tremor, initial evaluation was on August 18, 2019   I reviewed and summarized the referring note.  Past medical history Hypertension,   She retired from [employer] job in 2019, denies difficulty handling her job, has become less active since retirement, she reported sometimes she walked down the hallway, forgot why she was there, after a while, it will come back to her, sometimes she started thought, difficult to carry through.  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 MMSE 29/30 today. Here with husband, he notices short term memory trouble, anything within the last week or so. Ex: forget where her phone is, glasses, cooking something can't remember the recipe. Keep calendar of appointments, remembers medications, "have been same forever". Drives well, once got lost in [facility], but turned on GPS and got home. Memory troubles started at time of retirement in 2019. Denies mood issues, no depression. Previously tested for OSA, was negative, she does snore, thinks 10 years ago.   Update July 07, 2020 SS: MMSE 29/30  today. Has part-time job, at [employer] in [place]. Lives with husband. Main issue, misplacing things, not remembering what needed in room. Some weeks worse than others. Does the cooking, manage household, drives. She prefers to stay home. Hobbies are painting, reading. Feels she sleeps well, does snore, gets up several times during to urinate. Hasn't noticed right hand tremor much, but does see with writing. No major health issues. ESS 6.  Had neuropsychological testing March 2022, felt mild neurocognitive disorder, felt repeat testing in 24 to 36 months.  Presence of right hand tremor, cannot completely rule out something in the parkinsonian spectrum, but no strong evidence at this time.  REVIEW OF SYSTEMS: Out of a complete 14 system review of symptoms, the patient complains only of the following symptoms, and all other reviewed systems are negative.  Memory loss  ALLERGIES: No Known Allergies  HOME MEDICATIONS: Outpatient Medications Prior to Visit  Medication Sig Dispense Refill   atorvastatin (LIPITOR) 40 MG tablet TAKE 1 TABLET BY MOUTH EVERY DAY 90 tablet 0   CALCIUM PO Take by mouth.     celecoxib (CELEBREX) 200 MG capsule One to 2 tablets by mouth daily as needed for pain. 60 capsule 2   Cholecalciferol (VITAMIN D3 PO) Take by mouth.     Flaxseed, Linseed, (FLAX SEEDS PO) Take by mouth.     GARLIC PO Take by mouth.     glucose blood test strip Use up to 4 times per day as directed with glucometer. Disp: 100. Refill x99 100 strip 99   glucose monitoring kit (FREESTYLE) monitoring kit GLUCOMETER  PER INSURANCE PREFERENCE - SUBSTITUTION PERMITTED 1 each 99   latanoprost (XALATAN) 0.005 % ophthalmic solution INSTILL 1 DROP INTO BOTH EYES IN THE EVENING     lisinopril (ZESTRIL) 30 MG tablet TAKE 1 TABLET BY MOUTH EVERY DAY 90 tablet 0   MAGNESIUM PO Take by mouth.     Multiple Vitamin (MULTIVITAMIN) tablet Take 1 tablet by mouth daily.     NIACIN PO Take by mouth.     Omega-3 Fatty  Acids (FISH OIL PO) Take by mouth.     OneTouch Delica Lancets 10C MISC As directed up to qid SUBSTITUTION PERMITTED 585 each 99   VITAMIN E PO Take by mouth.     No facility-administered medications prior to visit.    PAST MEDICAL HISTORY: Past Medical History:  Diagnosis Date   Anemia, unspecified 04/29/2006   Benign neoplasm of skin 04/25/2007   Cardiac risk counseling 03/11/2015   Ten-year ASCVD risk 15.1% (calculated 03/11/2015) given hyperlipidemia, moderately well-controlled blood pressure, age. Prediabetes is also a concern, as well as increased BMI   Chronic right hip pain 08/18/2019   Essential hypertension, benign 08/17/2010   Former smoker    Quit 09/1988   Hyperlipidemia    Menopause    age 65   Prediabetes 03/08/2015    PAST SURGICAL HISTORY: Past Surgical History:  Procedure Laterality Date   CESAREAN SECTION     COLONOSCOPY  07/04/2006   ltcs      FAMILY HISTORY: Family History  Problem Relation Age of Onset   Heart disease Mother 24       MI   Diabetes Mother    Glaucoma Mother    Hyperlipidemia Sister    Colon cancer Neg Hx    Colon polyps Neg Hx    Esophageal cancer Neg Hx    Rectal cancer Neg Hx    Stomach cancer Neg Hx     SOCIAL HISTORY: Social History   Socioeconomic History   Marital status: Married    Spouse name: Not on file   Number of children: Not on file   Years of education: 15   Highest education level: Some college, no degree  Occupational History   Occupation: Retired  Tobacco Use   Smoking status: Former    Pack years: 0.00    Types: Cigarettes    Quit date: 1990    Years since quitting: 32.5   Smokeless tobacco: Never  Vaping Use   Vaping Use: Never used  Substance and Sexual Activity   Alcohol use: Yes    Comment: occasionally   Drug use: No   Sexual activity: Not Currently  Other Topics Concern   Not on file  Social History Narrative   Not on file   Social Determinants of Health   Financial Resource  Strain: Not on file  Food Insecurity: Not on file  Transportation Needs: Not on file  Physical Activity: Not on file  Stress: Not on file  Social Connections: Not on file  Intimate Partner Violence: Not on file   PHYSICAL EXAM  Vitals:   07/07/20 0930  BP: (!) 147/78  Pulse: 66  Weight: 146 lb (66.2 kg)  Height: '4\' 11"'  (1.499 m)    Body mass index is 29.49 kg/m.  Generalized: Well developed, in no acute distress  MMSE - Mini Mental State Exam 07/07/2020 02/18/2020 08/18/2019  Orientation to time '4 5 4  ' Orientation to Place '5 5 5  ' Registration '3 3 3  ' Attention/ Calculation 5 5 4  Recall '3 2 2  ' Language- name 2 objects '2 2 2  ' Language- repeat '1 1 1  ' Language- follow 3 step command '3 3 3  ' Language- read & follow direction '1 1 1  ' Write a sentence '1 1 1  ' Copy design '1 1 1  ' Total score '29 29 27    ' Neurological examination  Mentation: Alert oriented to time, place, history taking. Follows all commands speech and language fluent Cranial nerve II-XII: Pupils were equal round reactive to light. Extraocular movements were full, visual field were full on confrontational test. Facial sensation and strength were normal.  Head turning and shoulder shrug  were normal and symmetric. Motor: The motor testing reveals 5 over 5 strength of all 4 extremities. Good symmetric motor tone is noted throughout.  Sensory: Sensory testing is intact to soft touch on all 4 extremities. No evidence of extinction is noted.  Coordination: Cerebellar testing reveals good finger-nose-finger and heel-to-shin bilaterally.  Gait and station: Gait is normal. Tandem gait is normal.  Reflexes: Deep tendon reflexes are symmetric and normal bilaterally.   DIAGNOSTIC DATA (LABS, IMAGING, TESTING) - I reviewed patient records, labs, notes, testing and imaging myself where available.  Lab Results  Component Value Date   WBC 4.2 03/04/2019   HGB 15.0 03/04/2019   HCT 44.8 03/04/2019   MCV 84.4 03/04/2019   PLT  275 03/04/2019      Component Value Date/Time   NA 136 03/04/2019 0913   K 4.8 03/04/2019 0913   CL 100 03/04/2019 0913   CO2 30 03/04/2019 0913   GLUCOSE 99 03/04/2019 0913   BUN 15 03/04/2019 0913   CREATININE 0.86 03/04/2019 0913   CALCIUM 10.2 03/04/2019 0913   PROT 7.8 03/04/2019 0913   ALBUMIN 4.1 11/30/2014 0913   AST 21 03/04/2019 0913   ALT 19 03/04/2019 0913   ALKPHOS 69 11/30/2014 0913   BILITOT 0.4 03/04/2019 0913   GFRNONAA 71 03/04/2019 0913   GFRAA 83 03/04/2019 0913   Lab Results  Component Value Date   CHOL 221 (H) 03/04/2019   HDL 58 03/04/2019   LDLCALC 143 (H) 03/04/2019   TRIG 95 03/04/2019   CHOLHDL 3.8 03/04/2019   Lab Results  Component Value Date   HGBA1C 5.9 (H) 03/04/2019   Lab Results  Component Value Date   VITAMINB12 547 03/04/2019   Lab Results  Component Value Date   TSH 1.30 03/04/2019   ASSESSMENT AND PLAN 65 y.o. year old female  has a past medical history of Anemia, unspecified (04/29/2006), Benign neoplasm of skin (04/25/2007), Cardiac risk counseling (03/11/2015), Chronic right hip pain (08/18/2019), Essential hypertension, benign (08/17/2010), Former smoker, Hyperlipidemia, Menopause, and Prediabetes (03/08/2015). here with:  1. Mild cognitive impairment -Neuropsych testing suggested mild neurocognitive impairment  -MMSE is stable 29/30, remains quite functional  -Will continue to follow memory overtime -MRI of the brain was normal -Laboratory evaluation showed no treatable etiology -EEG was normal Aug 2021 -Talked about memory medications, hold off for now -ESS 6, only symptoms of OSA is snoring, revisit at next visit -Recommend healthy lifestyle, moderate exercise -Follow-up in 6 months or sooner if needed  I spent 30 minutes of face-to-face and non-face-to-face time with patient.  This included previsit chart review, lab review, study review, discussing neuropsych evaluation, medications, management and  follow-up  Evangeline Dakin, DNP 07/07/2020, 9:36 AM [facility] [address], [place], [place] [address] [phone]

## 2020-09-01 ENCOUNTER — Other Ambulatory Visit: Payer: Self-pay

## 2020-09-01 ENCOUNTER — Encounter: Payer: Self-pay | Admitting: Osteopathic Medicine

## 2020-09-01 ENCOUNTER — Ambulatory Visit (INDEPENDENT_AMBULATORY_CARE_PROVIDER_SITE_OTHER): Payer: Medicare HMO | Admitting: Osteopathic Medicine

## 2020-09-01 VITALS — BP 147/65 | HR 70 | Temp 98.1°F | Wt 142.1 lb

## 2020-09-01 DIAGNOSIS — Z1231 Encounter for screening mammogram for malignant neoplasm of breast: Secondary | ICD-10-CM | POA: Diagnosis not present

## 2020-09-01 DIAGNOSIS — R7303 Prediabetes: Secondary | ICD-10-CM

## 2020-09-01 DIAGNOSIS — E785 Hyperlipidemia, unspecified: Secondary | ICD-10-CM

## 2020-09-01 DIAGNOSIS — I10 Essential (primary) hypertension: Secondary | ICD-10-CM

## 2020-09-01 DIAGNOSIS — Z780 Asymptomatic menopausal state: Secondary | ICD-10-CM | POA: Diagnosis not present

## 2020-09-01 DIAGNOSIS — Z0000 Encounter for general adult medical examination without abnormal findings: Secondary | ICD-10-CM | POA: Diagnosis not present

## 2020-09-01 MED ORDER — LISINOPRIL 30 MG PO TABS: 30.0000 mg | ORAL_TABLET | Freq: Every day | ORAL | 3 refills | Status: DC

## 2020-09-01 MED ORDER — ATORVASTATIN CALCIUM 40 MG PO TABS: 40.0000 mg | ORAL_TABLET | Freq: Every day | ORAL | 3 refills | Status: DC

## 2020-09-01 NOTE — Progress Notes (Signed)
Subjective:    Ashley Haynes is a 65 y.o. female who presents for a Welcome to Medicare exam.   Review of Systems Negative        Objective:    Today's Vitals   09/01/20 1445 09/01/20 1452  BP: (!) 150/67 (!) 147/65  Pulse: 71 70  Temp: 98.1 F (36.7 C)   TempSrc: Oral   Weight: 142 lb 1.9 oz (64.5 kg)   Body mass index is 28.7 kg/m.  Medications Outpatient Encounter Medications as of 09/01/2020  Medication Sig   atorvastatin (LIPITOR) 40 MG tablet TAKE 1 TABLET BY MOUTH EVERY DAY   CALCIUM PO Take by mouth.   celecoxib (CELEBREX) 200 MG capsule One to 2 tablets by mouth daily as needed for pain.   Cholecalciferol (VITAMIN D3 PO) Take by mouth.   Flaxseed, Linseed, (FLAX SEEDS PO) Take by mouth.   GARLIC PO Take by mouth.   glucose blood test strip Use up to 4 times per day as directed with glucometer. Disp: 100. Refill x99   glucose monitoring kit (FREESTYLE) monitoring kit GLUCOMETER PER INSURANCE PREFERENCE - SUBSTITUTION PERMITTED   latanoprost (XALATAN) 0.005 % ophthalmic solution INSTILL 1 DROP INTO BOTH EYES IN THE EVENING   lisinopril (ZESTRIL) 30 MG tablet TAKE 1 TABLET BY MOUTH EVERY DAY   MAGNESIUM PO Take by mouth.   Multiple Vitamin (MULTIVITAMIN) tablet Take 1 tablet by mouth daily.   NIACIN PO Take by mouth.   Omega-3 Fatty Acids (FISH OIL PO) Take by mouth.   OneTouch Delica Lancets 62H MISC As directed up to qid SUBSTITUTION PERMITTED   VITAMIN E PO Take by mouth.   No facility-administered encounter medications on file as of 09/01/2020.     History: Past Medical History:  Diagnosis Date   Anemia, unspecified 04/29/2006   Benign neoplasm of skin 04/25/2007   Cardiac risk counseling 03/11/2015   Ten-year ASCVD risk 15.1% (calculated 03/11/2015) given hyperlipidemia, moderately well-controlled blood pressure, age. Prediabetes is also a concern, as well as increased BMI   Chronic right hip pain 08/18/2019   Essential hypertension, benign  08/17/2010   Former smoker    Quit 09/1988   Hyperlipidemia    Menopause    age 80   Prediabetes 03/08/2015   Past Surgical History:  Procedure Laterality Date   CESAREAN SECTION     COLONOSCOPY  07/04/2006   ltcs      Family History  Problem Relation Age of Onset   Heart disease Mother 53       MI   Diabetes Mother    Glaucoma Mother    Hyperlipidemia Sister    Colon cancer Neg Hx    Colon polyps Neg Hx    Esophageal cancer Neg Hx    Rectal cancer Neg Hx    Stomach cancer Neg Hx    Social History   Occupational History   Occupation: Retired  Tobacco Use   Smoking status: Former    Types: Cigarettes    Quit date: 1990    Years since quitting: 32.6   Smokeless tobacco: Never  Vaping Use   Vaping Use: Never used  Substance and Sexual Activity   Alcohol use: Yes    Comment: occasionally   Drug use: No   Sexual activity: Not Currently    Tobacco Counseling Counseling given: Not Answered   Immunizations and Health Maintenance Immunization History  Administered Date(s) Administered   Influenza,inj,Quad PF,6+ Mos 11/30/2014, 10/28/2018   Influenza-Unspecified 10/30/2019  PFIZER(Purple Top)SARS-COV-2 Vaccination 03/09/2019, 03/31/2019, 10/30/2019   Td 05/27/2006   Tdap 05/27/2018   Health Maintenance Due  Topic Date Due   DEXA SCAN  11/29/2019   PAP SMEAR-Modifier  12/07/2019    Activities of Daily Living No flowsheet data found.  Physical Exam   (optional), or other factors deemed appropriate based on the beneficiary's medical and social history and current clinical standards. Constitutional:  VSS, see nurse notes General Appearance: alert, well-developed, well-nourished, NAD Eyes: Normal lids and conjunctive, non-icteric sclera PERRLA Ears, Nose, Mouth, Throat: Normal appearance Normal external auditory canal and TM bilaterally MMM, posterior pharynx without erythema/exudate Neck: No masses, trachea midline No thyroid  enlargement/tenderness/mass appreciated Respiratory: Normal respiratory effort No dullness/hyper-resonance to percussion Breath sounds normal, no wheeze/rhonchi/rales Cardiovascular: S1/S2 normal, no murmur/rub/gallop auscultated No carotid bruit or JVD Pedal pulse II/IV bilaterally DP and PT No lower extremity edema Gastrointestinal: Nontender, no masses No hepatomegaly, no splenomegaly No hernia appreciated Musculoskeletal:  Gait normal No clubbing/cyanosis of digits Neurological: No cranial nerve deficit on limited exam Motor and sensation intact and symmetric Psychiatric: Normal judgment/insight Normal mood and affect   Advanced Directives: Patient has an advanced directive, copy has not yet been provided      Assessment:    This is a routine wellness examination for this patient .  No significant concerns noted  The primary encounter diagnosis was Medicare annual wellness visit, initial. Diagnoses of Hyperlipidemia, Essential hypertension, benign, Prediabetes, Postmenopausal, and Breast cancer screening by mammogram were also pertinent to this visit.  Vision/Hearing screen No results found.  Dietary issues and exercise activities discussed:      Goals   None    Depression Screen PHQ 2/9 Scores 08/14/2019 05/27/2018  PHQ - 2 Score 1 0  PHQ- 9 Score 1 2     Fall Risk Fall Risk  08/14/2019  Falls in the past year? 1  Number falls in past yr: 0  Injury with Fall? 0    Cognitive Function: MMSE - Mini Mental State Exam 07/07/2020 02/18/2020 08/18/2019  Orientation to time '4 5 4  ' Orientation to Place '5 5 5  ' Registration '3 3 3  ' Attention/ Calculation '5 5 4  ' Recall '3 2 2  ' Language- name 2 objects '2 2 2  ' Language- repeat '1 1 1  ' Language- follow 3 step command '3 3 3  ' Language- read & follow direction '1 1 1  ' Write a sentence '1 1 1  ' Copy design '1 1 1  ' Total score '29 29 27   ' Montreal Cognitive Assessment  08/14/2019  Visuospatial/ Executive (0/5) 5  Naming (0/3)  3  Attention: Read list of digits (0/2) 2  Attention: Read list of letters (0/1) 1  Attention: Serial 7 subtraction starting at 100 (0/3) 2  Language: Repeat phrase (0/2) 2  Language : Fluency (0/1) 0  Abstraction (0/2) 2  Delayed Recall (0/5) 1  Orientation (0/6) 5  Total 23  Adjusted Score (based on education) 23      Patient Care Team: Emeterio Reeve, DO as PCP - General (Osteopathic Medicine)     Plan:     I have personally reviewed and noted the following in the patient's chart:    1. Medicare annual wellness visit, initial Done  2. Hyperlipidemia Due to repeat labs  3. Essential hypertension, benign Due to repeat labs  4. Prediabetes Due to repeat labs  5. Postmenopausal Bone density test ordered Patient is scheduled to come in next week for Pap, if this is  normal then no need to continue Pap screening for cervical cancer  6. Breast cancer screening by mammogram Ordered mammogram  Medical and social history Use of alcohol, tobacco or illicit drugs  Current medications and supplements Functional ability and status Nutritional status Physical activity Advanced directives List of other physicians Hospitalizations, surgeries, and ER visits in previous 12 months Vitals Screenings to include cognitive, depression, and falls Referrals and appointments  In addition, I have reviewed and discussed with patient certain preventive protocols, quality metrics, and best practice recommendations. A written personalized care plan for preventive services as well as general preventive health recommendations were provided to patient.     Emeterio Reeve, DO 09/01/2020

## 2020-09-01 NOTE — Patient Instructions (Addendum)
Pap next week Vaccines next week: flu shot sometime this fall, Shingles vaccine at pharmacy is an option, Pneumonia vaccine  Mammogram and Bone Density test downstairs

## 2020-09-05 ENCOUNTER — Encounter: Payer: Self-pay | Admitting: Osteopathic Medicine

## 2020-09-05 ENCOUNTER — Other Ambulatory Visit (HOSPITAL_COMMUNITY)
Admission: RE | Admit: 2020-09-05 | Discharge: 2020-09-05 | Disposition: A | Discharge status: Home / Self Care | Payer: Medicare HMO | Source: Ambulatory Visit | Attending: Osteopathic Medicine | Admitting: Osteopathic Medicine

## 2020-09-05 ENCOUNTER — Other Ambulatory Visit: Payer: Self-pay

## 2020-09-05 ENCOUNTER — Ambulatory Visit: Payer: Medicare HMO | Admitting: Osteopathic Medicine

## 2020-09-05 ENCOUNTER — Ambulatory Visit (INDEPENDENT_AMBULATORY_CARE_PROVIDER_SITE_OTHER): Payer: Medicare HMO | Admitting: Osteopathic Medicine

## 2020-09-05 VITALS — BP 104/66 | HR 67 | Ht 59.0 in | Wt 139.0 lb

## 2020-09-05 DIAGNOSIS — Z124 Encounter for screening for malignant neoplasm of cervix: Secondary | ICD-10-CM | POA: Insufficient documentation

## 2020-09-05 DIAGNOSIS — Z1151 Encounter for screening for human papillomavirus (HPV): Secondary | ICD-10-CM | POA: Diagnosis not present

## 2020-09-05 DIAGNOSIS — R7303 Prediabetes: Secondary | ICD-10-CM | POA: Diagnosis not present

## 2020-09-05 DIAGNOSIS — Z01419 Encounter for gynecological examination (general) (routine) without abnormal findings: Secondary | ICD-10-CM | POA: Diagnosis not present

## 2020-09-05 DIAGNOSIS — I10 Essential (primary) hypertension: Secondary | ICD-10-CM | POA: Diagnosis not present

## 2020-09-05 DIAGNOSIS — Z0000 Encounter for general adult medical examination without abnormal findings: Secondary | ICD-10-CM | POA: Diagnosis not present

## 2020-09-05 DIAGNOSIS — Z23 Encounter for immunization: Secondary | ICD-10-CM

## 2020-09-05 DIAGNOSIS — E785 Hyperlipidemia, unspecified: Secondary | ICD-10-CM | POA: Diagnosis not present

## 2020-09-05 NOTE — Progress Notes (Signed)
Ashley Haynes is a 65 y.o. female who presents to  [facility] [facility]  today, 09/05/20, seeking care for the following:  PAP  Prevnar-20 Labs - ordered last visit       [place] with other pertinent findings:  The primary encounter diagnosis was Cervical cancer screening. A diagnosis of Need for pneumococcal vaccination was also pertinent to this visit.    There are no Patient Instructions on file for this visit.  Orders Placed This Encounter  Procedures   Pneumococcal conjugate vaccine 20-valent (Prevnar 20)    No orders of the defined types were placed in this encounter.    See below for relevant physical exam findings  See below for recent lab and imaging results reviewed  Medications, allergies, PMH, PSH, SocH, FamH reviewed below    Follow-up instructions: Return for RECHECK PENDING RESULTS / IF NO PROBLEMS CANSCHEDULE ANNUAL PHYSICAL VISIT AND MEDICARE VISIT 1 YR.                                        Exam:  BP 104/66   Pulse 67   Ht _0  (1.499 m)   Wt 139 lb (63 kg)   BMI 28.07 kg/m  Constitutional: VS see above. General Appearance: alert, well-developed, well-nourished, NAD Neck: No masses, trachea midline.  Respiratory: Normal respiratory effort.  Musculoskeletal: Gait normal.  Psychiatric: Normal judgment/insight. Normal mood and affect. Oriented x3.  GYN: No lesions/ulcers to external genitalia, normal urethra, normal vaginal mucosa atrophic, physiologic discharge, cervix normal without lesions, uterus not enlarged or tender, adnexa no masses and nontender  Current Meds  Medication Sig   atorvastatin (LIPITOR) 40 MG tablet Take 1 tablet (40 mg total) by mouth daily.   CALCIUM PO Take by mouth.   celecoxib (CELEBREX) 200 MG capsule One to 2 tablets by mouth daily as needed for pain.   Cholecalciferol (VITAMIN D3 PO) Take by mouth.   Flaxseed,  Linseed, (FLAX SEEDS PO) Take by mouth.   GARLIC PO Take by mouth.   glucose blood test strip Use up to 4 times per day as directed with glucometer. Disp: 100. Refill x99   glucose monitoring kit (FREESTYLE) monitoring kit GLUCOMETER PER INSURANCE PREFERENCE - SUBSTITUTION PERMITTED   latanoprost (XALATAN) 0.005 % ophthalmic solution INSTILL 1 DROP INTO BOTH EYES IN THE EVENING   lisinopril (ZESTRIL) 30 MG tablet Take 1 tablet (30 mg total) by mouth daily.   MAGNESIUM PO Take by mouth.   Multiple Vitamin (MULTIVITAMIN) tablet Take 1 tablet by mouth daily.   NIACIN PO Take by mouth.   Omega-3 Fatty Acids (FISH OIL PO) Take by mouth.   OneTouch Delica Lancets 27M MISC As directed up to qid SUBSTITUTION PERMITTED   VITAMIN E PO Take by mouth.    No Known Allergies  Patient Active Problem List   Diagnosis Date Noted   Mild cognitive impairment 07/07/2020   Chronic right hip pain 08/18/2019   Prediabetes 03/08/2015   Hyperlipidemia 03/08/2015   Former smoker 11/30/2014   Essential hypertension, benign 08/17/2010   Benign neoplasm of skin 04/25/2007   Anemia, unspecified 04/29/2006   Symptomatic menopausal or female climacteric states 04/29/2006    Family History  Problem Relation Age of Onset   Heart disease Mother 5       MI   Diabetes Mother  Glaucoma Mother    Hyperlipidemia Sister    Colon cancer Neg Hx    Colon polyps Neg Hx    Esophageal cancer Neg Hx    Rectal cancer Neg Hx    Stomach cancer Neg Hx     Social History   Tobacco Use  Smoking Status Former   Types: Cigarettes   Quit date: 1990   Years since quitting: 32.6  Smokeless Tobacco Never    Past Surgical History:  Procedure Laterality Date   CESAREAN SECTION     COLONOSCOPY  07/04/2006   ltcs      Immunization History  Administered Date(s) Administered   Influenza,inj,Quad PF,6+ Mos 11/30/2014, 10/28/2018   Influenza-Unspecified 10/30/2019   PFIZER(Purple Top)SARS-COV-2 Vaccination  03/09/2019, 03/31/2019, 10/30/2019   PNEUMOCOCCAL CONJUGATE-20 09/05/2020   Td 05/27/2006   Tdap 05/27/2018    No results found for this or any previous visit (from the past 2160 hour(s)).  No results found.     All questions at time of visit were answered - patient instructed to contact office with any additional concerns or updates. ER/RTC precautions were reviewed with the patient as applicable.   Please note: manual typing as well as voice recognition software may have been used to produce this document - typos may escape review. Please contact Dr. Sheppard Coil for any needed clarifications.   Total encounter time on date of service, 09/05/20, was 10 minutes spent addressing problems/issues as noted above in [place], including time spent in discussion with patient regarding the HPI, ROS, confirming history, reviewing Assessment & Plan, as well as time spent on coordination of care, record review.

## 2020-09-06 LAB — LIPID PANEL
Cholesterol: 202 mg/dL — ABNORMAL HIGH
HDL: 68 mg/dL
LDL Cholesterol (Calc): 117 mg/dL — ABNORMAL HIGH
Non-HDL Cholesterol (Calc): 134 mg/dL — ABNORMAL HIGH
Total CHOL/HDL Ratio: 3 (calc)
Triglycerides: 80 mg/dL

## 2020-09-06 LAB — HEMOGLOBIN A1C
Hgb A1c MFr Bld: 5.6 %{Hb}
Mean Plasma Glucose: 114 mg/dL
eAG (mmol/L): 6.3 mmol/L

## 2020-09-06 LAB — COMPLETE METABOLIC PANEL WITHOUT GFR
AG Ratio: 1.4 (calc) (ref 1.0–2.5)
ALT: 34 U/L — ABNORMAL HIGH (ref 6–29)
AST: 32 U/L (ref 10–35)
Albumin: 4.4 g/dL (ref 3.6–5.1)
Alkaline phosphatase (APISO): 80 U/L (ref 37–153)
BUN: 14 mg/dL (ref 7–25)
CO2: 30 mmol/L (ref 20–32)
Calcium: 10.3 mg/dL (ref 8.6–10.4)
Chloride: 101 mmol/L (ref 98–110)
Creat: 0.71 mg/dL (ref 0.50–1.05)
Globulin: 3.2 g/dL (ref 1.9–3.7)
Glucose, Bld: 88 mg/dL (ref 65–99)
Potassium: 4.6 mmol/L (ref 3.5–5.3)
Sodium: 137 mmol/L (ref 135–146)
Total Bilirubin: 0.5 mg/dL (ref 0.2–1.2)
Total Protein: 7.6 g/dL (ref 6.1–8.1)
eGFR: 94 mL/min/{1.73_m2}

## 2020-09-06 LAB — CBC
HCT: 46.7 % — ABNORMAL HIGH (ref 35.0–45.0)
Hemoglobin: 15.5 g/dL (ref 11.7–15.5)
MCH: 27.5 pg (ref 27.0–33.0)
MCHC: 33.2 g/dL (ref 32.0–36.0)
MCV: 82.8 fL (ref 80.0–100.0)
MPV: 9.8 fL (ref 7.5–12.5)
Platelets: 238 10*3/uL (ref 140–400)
RBC: 5.64 Million/uL — ABNORMAL HIGH (ref 3.80–5.10)
RDW: 14.4 % (ref 11.0–15.0)
WBC: 4.2 10*3/uL (ref 3.8–10.8)

## 2020-09-06 LAB — TSH: TSH: 1.93 mIU/L (ref 0.40–4.50)

## 2020-09-07 LAB — CYTOLOGY - PAP
Adequacy: ABSENT
Comment: NEGATIVE
Diagnosis: NEGATIVE
High risk HPV: NEGATIVE

## 2020-09-21 ENCOUNTER — Ambulatory Visit (INDEPENDENT_AMBULATORY_CARE_PROVIDER_SITE_OTHER): Payer: Medicare HMO

## 2020-09-21 ENCOUNTER — Other Ambulatory Visit: Payer: Self-pay

## 2020-09-21 DIAGNOSIS — Z13820 Encounter for screening for osteoporosis: Secondary | ICD-10-CM | POA: Diagnosis not present

## 2020-09-21 DIAGNOSIS — Z780 Asymptomatic menopausal state: Secondary | ICD-10-CM

## 2020-09-21 DIAGNOSIS — Z1231 Encounter for screening mammogram for malignant neoplasm of breast: Secondary | ICD-10-CM | POA: Diagnosis not present

## 2020-09-21 NOTE — Progress Notes (Signed)
Chart reviewed, agree above plan ?

## 2021-01-17 NOTE — Progress Notes (Signed)
PATIENT: Ashley Haynes DOB: [birth year]  REASON FOR VISIT: follow up for memory HISTORY FROM: patient Primary Neurologist: Dr. Krista Blue  HISTORY  Ashley Haynes is a 66 year old female, seen in request by her primary care physician Dr. Emeterio Reeve, for evaluation of memory loss, right hand tremor, initial evaluation was on August 18, 2019   I reviewed and summarized the referring note.  Past medical history Hypertension,   She retired from [employer] job in 2019, denies difficulty handling her job, has become less active since retirement, she reported sometimes she walked down the hallway, forgot why she was there, after a while, it will come back to her, sometimes she started thought, difficult to carry through.  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 MMSE 29/30 today. Here with husband, he notices short term memory trouble, anything within the last week or so. Ex: forget where her phone is, glasses, cooking something can't remember the recipe. Keep calendar of appointments, remembers medications, "have been same forever". Drives well, once got lost in [facility], but turned on GPS and got home. Memory troubles started at time of retirement in 2019. Denies mood issues, no depression. Previously tested for OSA, was negative, she does snore, thinks 10 years ago.   Update July 07, 2020 SS: MMSE  29/30 today. Has part-time job, at [employer] in [place]. Lives with husband. Main issue, misplacing things, not remembering what needed in room. Some weeks worse than others. Does the cooking, manage household, drives. She prefers to stay home. Hobbies are painting, reading. Feels she sleeps well, does snore, gets up several times during to urinate. Hasn't noticed right hand tremor much, but does see with writing. No major health issues. ESS 6.  Had neuropsychological testing March 2022, felt mild neurocognitive disorder, felt repeat testing in 24 to 36 months.  Presence of right hand tremor, cannot completely rule out something in the parkinsonian spectrum, but no strong evidence at this time.  Update January 18, 2021 SS: Here today with husband, MMSE 29/30. Husband feels memory getting a little worse, forgot something a few hours, are benign examples, forgetting cream in coffee. Gets busy doing things, moves on to the next. Still driving, managing household well. No tremor in the right hand, but when painting finds herself holding the right hand paying attention to detail. No falls. She is more bothered by memory, gets frustrated, husband always reminding her, this gets on her nerves. Less snoring, previous sleep study was negative for OSA.  REVIEW OF SYSTEMS: Out of a complete 14 system review of symptoms, the patient complains only of the following symptoms, and all other reviewed systems are negative.  See HPI  ALLERGIES: No Known Allergies  HOME MEDICATIONS: Outpatient Medications Prior to Visit  Medication Sig Dispense Refill   atorvastatin (LIPITOR) 40 MG tablet Take 1 tablet (40 mg total) by mouth daily. 90 tablet  3   CALCIUM PO Take by mouth.     celecoxib (CELEBREX) 200 MG capsule One to 2 tablets by mouth daily as needed for pain. 60 capsule 2   Cholecalciferol (VITAMIN D3 PO) Take by mouth.     Flaxseed, Linseed, (FLAX SEEDS PO) Take by mouth.     GARLIC PO Take by mouth.      glucose blood test strip Use up to 4 times per day as directed with glucometer. Disp: 100. Refill x99 100 strip 99   glucose monitoring kit (FREESTYLE) monitoring kit GLUCOMETER PER INSURANCE PREFERENCE - SUBSTITUTION PERMITTED 1 each 99   latanoprost (XALATAN) 0.005 % ophthalmic solution INSTILL 1 DROP INTO BOTH EYES IN THE EVENING     lisinopril (ZESTRIL) 30 MG tablet Take 1 tablet (30 mg total) by mouth daily. 90 tablet 3   MAGNESIUM PO Take by mouth.     Multiple Vitamin (MULTIVITAMIN) tablet Take 1 tablet by mouth daily.     NIACIN PO Take by mouth.     Omega-3 Fatty Acids (FISH OIL PO) Take by mouth.     OneTouch Delica Lancets 56Y MISC As directed up to qid SUBSTITUTION PERMITTED 563 each 99   VITAMIN E PO Take by mouth.     No facility-administered medications prior to visit.    PAST MEDICAL HISTORY: Past Medical History:  Diagnosis Date   Anemia, unspecified 04/29/2006   Benign neoplasm of skin 04/25/2007   Cardiac risk counseling 03/11/2015   Ten-year ASCVD risk 15.1% (calculated 03/11/2015) given hyperlipidemia, moderately well-controlled blood pressure, age. Prediabetes is also a concern, as well as increased BMI   Chronic right hip pain 08/18/2019   Essential hypertension, benign 08/17/2010   Former smoker    Quit 09/1988   Hyperlipidemia    Menopause    age 51   Prediabetes 03/08/2015    PAST SURGICAL HISTORY: Past Surgical History:  Procedure Laterality Date   CESAREAN SECTION     COLONOSCOPY  07/04/2006   ltcs      FAMILY HISTORY: Family History  Problem Relation Age of Onset   Heart disease Mother 18       MI   Diabetes Mother    Glaucoma Mother    Hyperlipidemia Sister    Colon cancer Neg Hx    Colon polyps Neg Hx    Esophageal cancer Neg Hx    Rectal cancer Neg Hx    Stomach cancer Neg Hx     SOCIAL HISTORY: Social History   Socioeconomic History   Marital status: Married    Spouse name: Not on file   Number of children: Not on file    Years of education: 15   Highest education level: Some college, no degree  Occupational History   Occupation: Retired  Tobacco Use   Smoking status: Former    Types: Cigarettes    Quit date: 1990    Years since quitting: 33.0   Smokeless tobacco: Never  Vaping Use   Vaping Use: Never used  Substance and Sexual Activity   Alcohol use: Yes    Comment: occasionally   Drug use: No   Sexual activity: Not Currently  Other Topics Concern   Not on file  Social History Narrative   Not on file   Social Determinants of Health   Financial Resource Strain: Not on file  Food Insecurity: Not on file  Transportation Needs: Not on file  Physical Activity: Not on file  Stress: Not on file  Social  Connections: Not on file  Intimate Partner Violence: Not on file   PHYSICAL EXAM  Vitals:   01/18/21 0914  BP: 138/71  Pulse: 77  Weight: 146 lb 8 oz (66.5 kg)  Height: '4\' 11"'  (1.499 m)   Body mass index is 29.59 kg/m.  Generalized: Well developed, in no acute distress  MMSE - Mini Mental State Exam 01/18/2021 07/07/2020 02/18/2020  Orientation to time '5 4 5  ' Orientation to Place '5 5 5  ' Registration '3 3 3  ' Attention/ Calculation '5 5 5  ' Recall '2 3 2  ' Language- name 2 objects '2 2 2  ' Language- repeat '1 1 1  ' Language- follow 3 step command '3 3 3  ' Language- read & follow direction '1 1 1  ' Write a sentence '1 1 1  ' Copy design '1 1 1  ' Total score '29 29 29    ' Neurological examination  Mentation: Alert oriented to time, place, history taking. Follows all commands speech and language fluent Cranial nerve II-XII: Pupils were equal round reactive to light. Extraocular movements were full, visual field were full on confrontational test. Facial sensation and strength were normal.  Head turning and shoulder shrug  were normal and symmetric. Motor: The motor testing reveals 5 over 5 strength of all 4 extremities. Good symmetric motor tone is noted throughout.  No tremor noted.  No  bradykinesia. Sensory: Sensory testing is intact to soft touch on all 4 extremities. No evidence of extinction is noted.  Coordination: Cerebellar testing reveals good finger-nose-finger and heel-to-shin bilaterally.  Gait and station: Gait is normal. Tandem gait is normal.  Normal pace, arm swing. Reflexes: Deep tendon reflexes are symmetric and normal bilaterally.   DIAGNOSTIC DATA (LABS, IMAGING, TESTING) - I reviewed patient records, labs, notes, testing and imaging myself where available.  Lab Results  Component Value Date   WBC 4.2 09/05/2020   HGB 15.5 09/05/2020   HCT 46.7 (H) 09/05/2020   MCV 82.8 09/05/2020   PLT 238 09/05/2020      Component Value Date/Time   NA 137 09/05/2020 0000   K 4.6 09/05/2020 0000   CL 101 09/05/2020 0000   CO2 30 09/05/2020 0000   GLUCOSE 88 09/05/2020 0000   BUN 14 09/05/2020 0000   CREATININE 0.71 09/05/2020 0000   CALCIUM 10.3 09/05/2020 0000   PROT 7.6 09/05/2020 0000   ALBUMIN 4.1 11/30/2014 0913   AST 32 09/05/2020 0000   ALT 34 (H) 09/05/2020 0000   ALKPHOS 69 11/30/2014 0913   BILITOT 0.5 09/05/2020 0000   GFRNONAA 71 03/04/2019 0913   GFRAA 83 03/04/2019 0913   Lab Results  Component Value Date   CHOL 202 (H) 09/05/2020   HDL 68 09/05/2020   LDLCALC 117 (H) 09/05/2020   TRIG 80 09/05/2020   CHOLHDL 3.0 09/05/2020   Lab Results  Component Value Date   HGBA1C 5.6 09/05/2020   Lab Results  Component Value Date   KGOVPCHE03 524 03/04/2019   Lab Results  Component Value Date   TSH 1.93 09/05/2020   ASSESSMENT AND PLAN 66 y.o. year old female  has a past medical history of Anemia, unspecified (04/29/2006), Benign neoplasm of skin (04/25/2007), Cardiac risk counseling (03/11/2015), Chronic right hip pain (08/18/2019), Essential hypertension, benign (08/17/2010), Former smoker, Hyperlipidemia, Menopause, and Prediabetes (03/08/2015). here with:  1. Mild cognitive impairment -Neuropsych testing suggested mild  neurocognitive impairment  -MMSE is stable 29/30, Basya is well appearing, continues to be quite independent, functional  -Will try Aricept uptitrate to  10 mg at bedtime for memory -MRI of the brain was normal -Laboratory evaluation showed no treatable etiology -EEG was normal Aug 2021 -Recommend healthy lifestyle, moderate exercise, consider Silver Sneakers -Follow-up in 6 months or sooner if needed with Dr. Marilynn Latino, AGNP-C, DNP 01/18/2021, 9:25 AM [facility] [address], [place], [place] [address] [phone]

## 2021-01-18 ENCOUNTER — Ambulatory Visit (INDEPENDENT_AMBULATORY_CARE_PROVIDER_SITE_OTHER): Payer: No Typology Code available for payment source | Admitting: Neurology

## 2021-01-18 ENCOUNTER — Encounter: Payer: Self-pay | Admitting: Neurology

## 2021-01-18 VITALS — BP 138/71 | HR 77 | Ht 59.0 in | Wt 146.5 lb

## 2021-01-18 DIAGNOSIS — G3184 Mild cognitive impairment, so stated: Secondary | ICD-10-CM

## 2021-01-18 MED ORDER — DONEPEZIL HCL 10 MG PO TABS: ORAL_TABLET | ORAL | 5 refills | Status: DC

## 2021-01-18 NOTE — Patient Instructions (Signed)
Try Aricept for memory start taking 1/2 tablet at bedtime for 1 month then take 1 tablet at bedtime Meds ordered this encounter  Medications   donepezil (ARICEPT) 10 MG tablet    Sig: Take 1/2 tablet at bedtime for 1 month, then increase to 1 tablet at bedtime    Dispense:  30 tablet    Refill:  5   See you back in 6 months

## 2021-04-07 IMAGING — MR MR HEAD WO/W CM
11 series · 48 of 48 positions shown · IV contrast (gadavist)
Comparison: None.

CLINICAL DATA: Memory loss, progressive.

EXAM:
MRI HEAD WITHOUT AND WITH CONTRAST
TECHNIQUE: Multiplanar, multiecho pulse sequences of the brain and surrounding
structures were obtained without and with intravenous contrast.
CONTRAST:  7.5mL GADAVIST GADOBUTROL 1 MMOL/ML IV SOLN

[Series 3: DWI · axial · 3.0mm · 1.20mm/px · z∈[-76,+85]mm · 4 of 55 slices shown (1 of 2)]
[im 1/55]
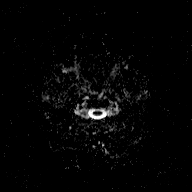
[im 19/55]
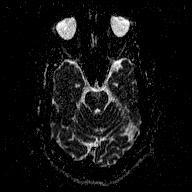
[im 37/55]
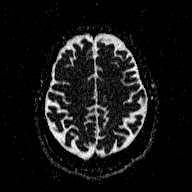
[im 55/55]
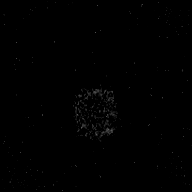

[Series 5: DWI · coronal · 3.0mm · 1.15mm/px · 3 of 45 slices shown (2 of 2)]
[im 1/45]
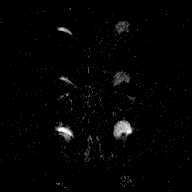
[im 23/45]
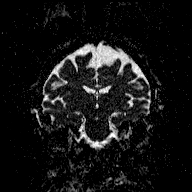
[im 45/45]
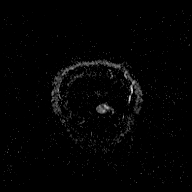

[Series 6: T1 · sagittal · 5.0mm · 0.45mm/px · 2 of 23 slices shown (1 of 2)]
[im 1/23]
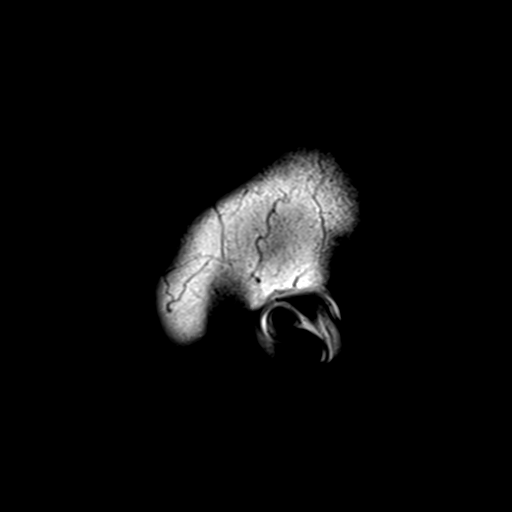
[im 23/23]
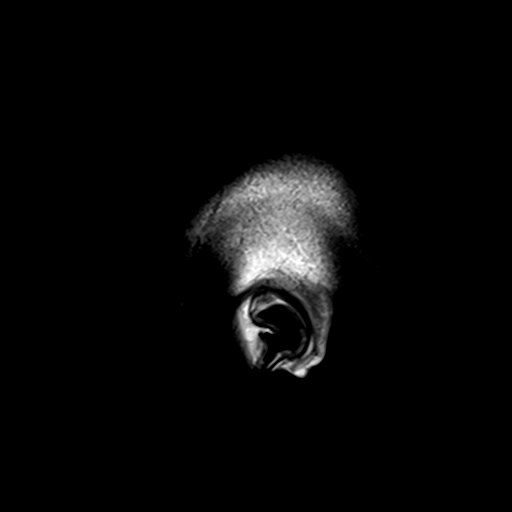

[Series 7: T2 · axial · 5.0mm · 0.72mm/px · z∈[-71,+81]mm · 2 of 23 slices shown (1 of 2)]
[im 1/23]
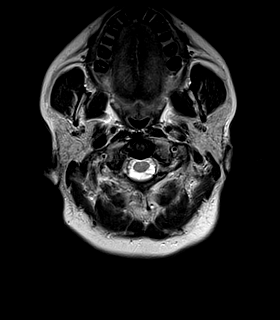
[im 23/23]
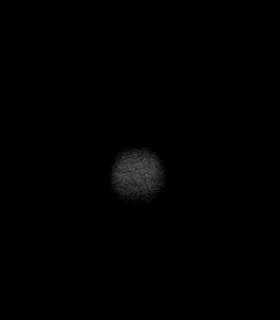

[Series 8: FLAIR · axial · 3.0mm · 0.45mm/px · z∈[-75,+85]mm · 4 of 55 slices shown]
[im 1/55]
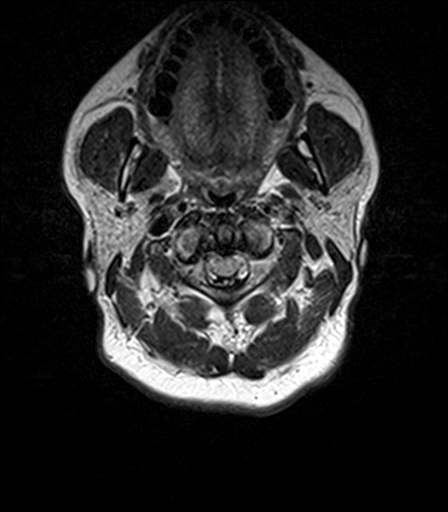
[im 19/55]
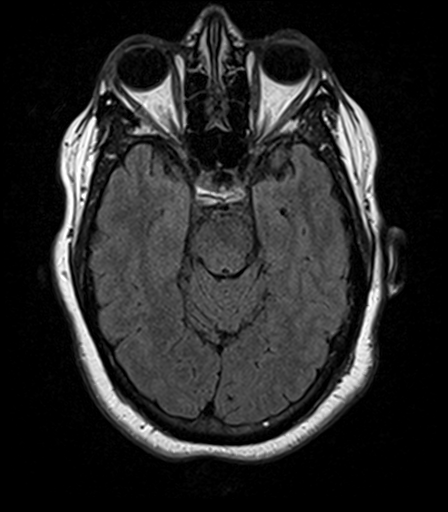
[im 37/55]
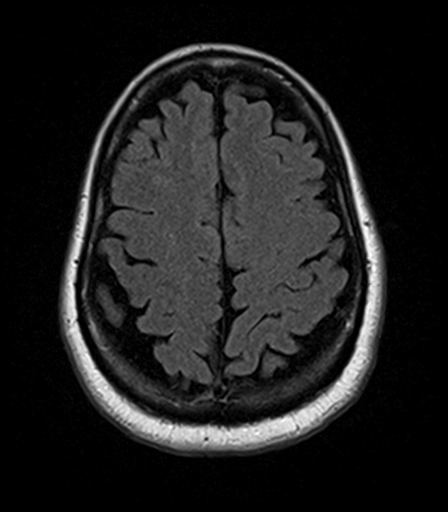
[im 55/55]
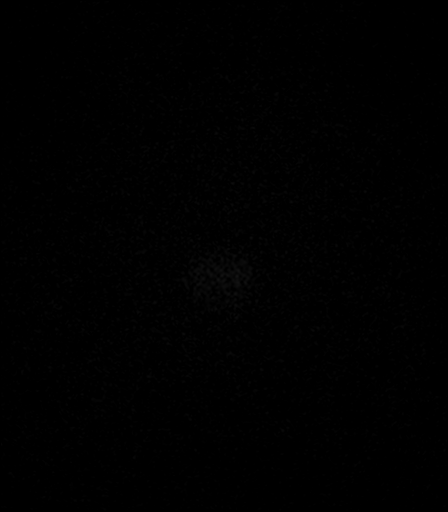

[Series 9: T2 · axial · 5.0mm · 0.72mm/px · z∈[-71,+81]mm · 2 of 23 slices shown (2 of 2)]
[im 1/23]
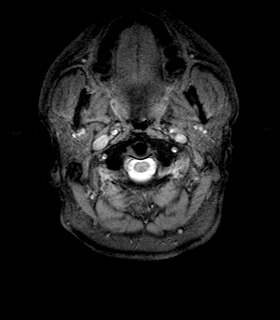
[im 23/23]
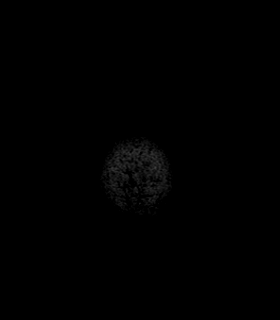

[Series 10: T1 · axial · 1.0mm · 1.00mm/px · z∈[-76,+81]mm · 11 of 160 slices shown (2 of 2)]
[im 1/160]
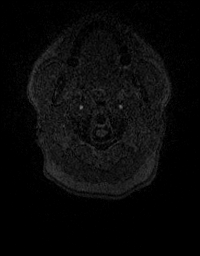
[im 16/160]
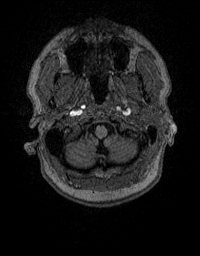
[im 32/160]
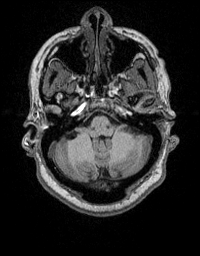
[im 48/160]
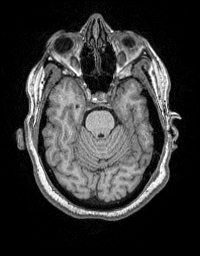
[im 64/160]
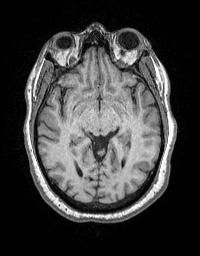
[im 80/160]
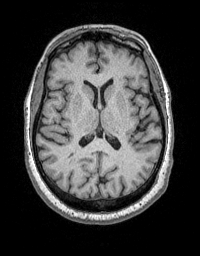
[im 96/160]
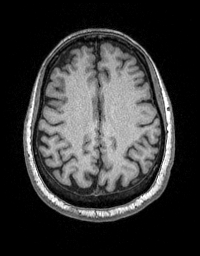
[im 112/160]
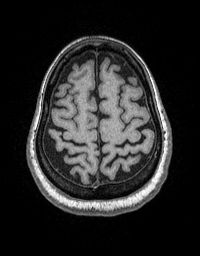
[im 128/160]
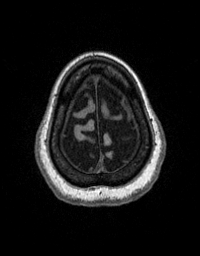
[im 144/160]
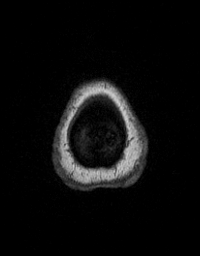
[im 160/160]
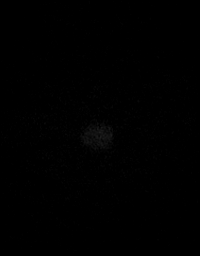

[Series 11: T1 post-contrast · axial · 1.0mm · 1.00mm/px · z∈[-76,+81]mm · 11 of 157 slices shown]
[im 1/157]
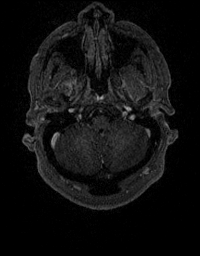
[im 16/157]
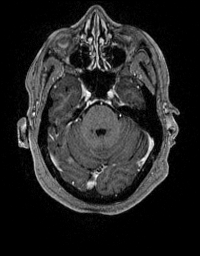
[im 32/157]
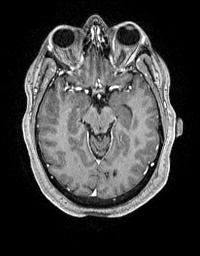
[im 47/157]
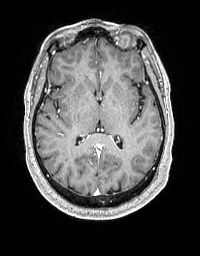
[im 63/157]
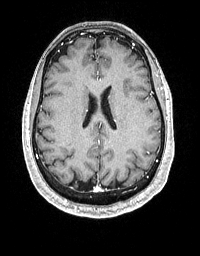
[im 79/157]
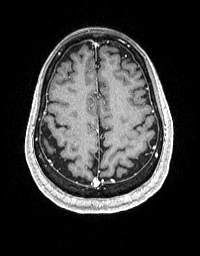
[im 94/157]
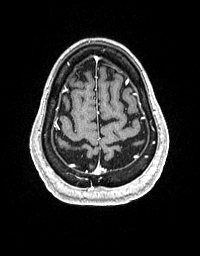
[im 110/157]
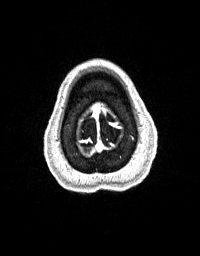
[im 125/157]
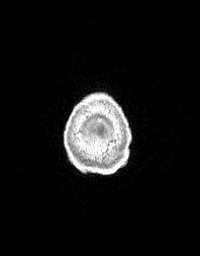
[im 141/157]
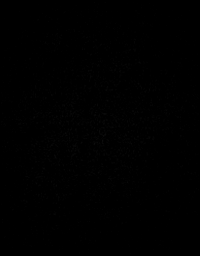
[im 157/157]
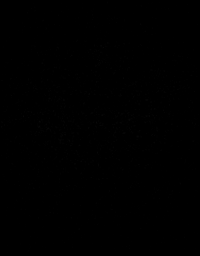

[Series 13: T2 post-contrast · coronal · 5.0mm · 0.43mm/px · 2 of 31 slices shown]
[im 1/31]
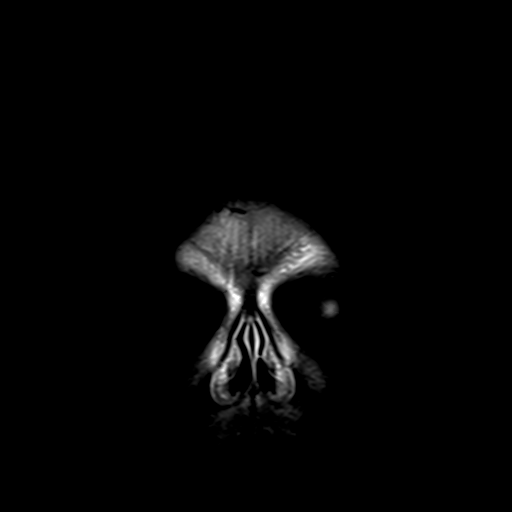
[im 31/31]
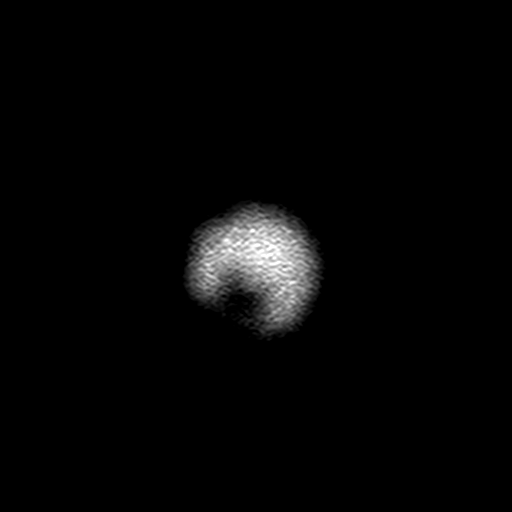

[Series 100: (id) ax · axial · 3.0mm · 1.20mm/px · z∈[-76,+85]mm · 4 of 55 slices shown]
[im 1/55]
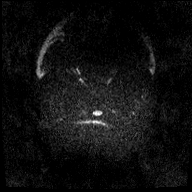
[im 19/55]
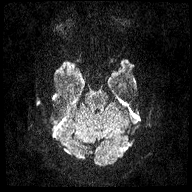
[im 37/55]
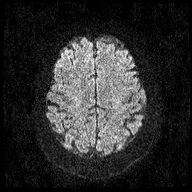
[im 55/55]
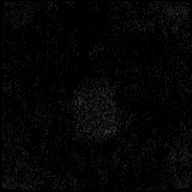

[Series 101: (id) cor · coronal · 3.0mm · 1.15mm/px · 3 of 44 slices shown]
[im 1/44]
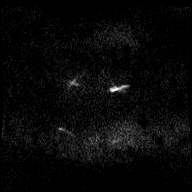
[im 22/44]
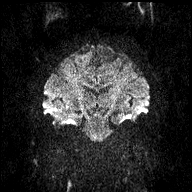
[im 44/44]
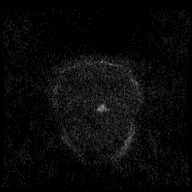

[48 of 48 positions shown; findings below may reference images not displayed]

FINDINGS: Brain: No acute infarction, hemorrhage, hydrocephalus, extra-axial
collection or mass lesion. No contrast enhancement seen. No
significant parenchymal volume loss.

Vascular: Normal flow voids.

Skull and upper cervical spine: Normal marrow signal.

Sinuses/Orbits: Negative.

Other: None.
IMPRESSION: No acute intracranial abnormality identified. Unremarkable MRI of
the brain.

## 2021-07-24 ENCOUNTER — Ambulatory Visit: Payer: No Typology Code available for payment source | Admitting: Neurology

## 2021-08-01 ENCOUNTER — Ambulatory Visit (INDEPENDENT_AMBULATORY_CARE_PROVIDER_SITE_OTHER): Payer: No Typology Code available for payment source | Admitting: Neurology

## 2021-08-01 ENCOUNTER — Encounter: Payer: Self-pay | Admitting: Neurology

## 2021-08-01 VITALS — BP 169/83 | HR 60 | Ht 59.0 in | Wt 146.0 lb

## 2021-08-01 DIAGNOSIS — G3184 Mild cognitive impairment, so stated: Secondary | ICD-10-CM | POA: Diagnosis not present

## 2021-08-01 MED ORDER — MEMANTINE HCL 10 MG PO TABS: 10.0000 mg | ORAL_TABLET | Freq: Two times a day (BID) | ORAL | 11 refills | Status: DC

## 2021-08-01 NOTE — Progress Notes (Signed)
Chief Complaint  Patient presents with   Follow-up    Rm 13. Accompanied by husband. States donepezil is not working well due to insomnia. She has discontinued the medication.      ASSESSMENT AND PLAN  Ashley Haynes is a 66 y.o. female   Mild cognitive impairment  MoCA examination 24/30, mostly short-term memory loss, most consistent with early stage of central nervous system degenerative disorder/Alzheimer's disease  Laboratory evaluation showed no treatable etiology   MRI of the brain showed no significant abnormalities  Emphasized importance of healthy lifestyle,  Tried Aricept could not tolerated, give her prescription of Namenda 10 mg twice a day,  Will continue follow-up with her primary care, only return to clinic for new issues  DIAGNOSTIC DATA (LABS, IMAGING, TESTING) - I reviewed patient records, labs, notes, testing and imaging myself where available.   MEDICAL HISTORY:  Ashley HUNKELE, seen in request by   Ashley Haynes is a 66 year old female, seen in request by her primary care physician Dr. Emeterio Reeve, for evaluation of memory loss, right hand tremor, initial evaluation was on August 18, 2019   I reviewed and summarized the referring note.  Past medical history Hypertension,   She retired from [employer] job in 2019, denies difficulty handling her job, has become less active since retirement, she reported sometimes she walked down the hallway, forgot why she was there, after a while, it will come back to her, sometimes she started thought, difficult to carry through.  She denies difficulty handling daily activity, still driving, pickup painting as her new hobby, sleeps well most of the time, but often woke up multiple times at night to use bathroom, sometimes difficulty falling back to sleep,   Father is at age 22, still sharp minded, does a lot of community work, keep up his plumbing license, mother died of diabetes and heart disease    Personally reviewed MRI of the brain March 2021 that was normal Laboratory evaluation February 2021: A1c 5.9, no significant abnormality on CBC, hemoglobin 15, CMP, LDL was elevated 143, normal TSH 1.3  EEG was normal.   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  PHYSICAL EXAMNIATION:  Gen: NAD, conversant, well nourised, well groomed                     Cardiovascular: Regular rate rhythm, no peripheral edema, warm, nontender. Eyes: Conjunctivae clear without exudates or hemorrhage Neck: Supple, no carotid bruits. Pulmonary: Clear to auscultation bilaterally   NEUROLOGICAL EXAM:  MENTAL STATUS: Speech/cognition: Awake, alert, oriented to history taking and casual conversation     08/01/2021   11:38 AM 08/14/2019    4:30 PM  Montreal Cognitive Assessment   Visuospatial/ Executive (0/5) 5 5  Naming (0/3) 3 3  Attention: Read list of digits (0/2) 2 2  Attention: Read list of letters (0/1) 1 1  Attention: Serial 7 subtraction starting  at 100 (0/3) 3 2  Language: Repeat phrase (0/2) 1 2  Language : Fluency (0/1) 1 0  Abstraction (0/2) 2 2  Delayed Recall (0/5) 1 1  Orientation (0/6) 5 5  Total 24 23  Adjusted Score (based on education) 24 23    CRANIAL NERVES: CN II: Visual fields are full to confrontation. Pupils are round equal and briskly reactive to light. CN III, IV, VI: extraocular movement are normal. No ptosis. CN V: Facial sensation is intact to light  touch CN VII: Face is symmetric with normal eye closure  CN VIII: Hearing is normal to causal conversation. CN IX, X: Phonation is normal. CN XI: Head turning and shoulder shrug are intact  MOTOR: There is no pronator drift of out-stretched arms. Muscle bulk and tone are normal. Muscle strength is normal.  REFLEXES: Reflexes are 2+ and symmetric at the biceps, triceps, knees, and ankles. Plantar responses are flexor.  SENSORY: Intact to light touch, pinprick   COORDINATION: There is no trunk or limb dysmetria noted.  GAIT/STANCE: Posture is normal. Gait is steady   REVIEW OF SYSTEMS:  Full 14 system review of systems performed and notable only for as above All other review of systems were negative.   ALLERGIES: No Known Allergies  HOME MEDICATIONS: Current Outpatient Medications  Medication Sig Dispense Refill   atorvastatin (LIPITOR) 40 MG tablet Take 1 tablet (40 mg total) by mouth daily. 90 tablet 3   CALCIUM PO Take by mouth.     celecoxib (CELEBREX) 200 MG capsule One to 2 tablets by mouth daily as needed for pain. 60 capsule 2   Cholecalciferol (VITAMIN D3 PO) Take by mouth.     Flaxseed, Linseed, (FLAX SEEDS PO) Take by mouth.     GARLIC PO Take by mouth.     glucose blood test strip Use up to 4 times per day as directed with glucometer. Disp: 100. Refill x99 100 strip 99   glucose monitoring kit (FREESTYLE) monitoring kit GLUCOMETER PER INSURANCE PREFERENCE - SUBSTITUTION PERMITTED 1 each 99   latanoprost (XALATAN) 0.005 % ophthalmic solution INSTILL 1 DROP INTO BOTH EYES IN THE EVENING     lisinopril (ZESTRIL) 30 MG tablet Take 1 tablet (30 mg total) by mouth daily. 90 tablet 3   MAGNESIUM PO Take by mouth.     Multiple Vitamin (MULTIVITAMIN) tablet Take 1 tablet by mouth daily.     NIACIN PO Take by mouth.     Omega-3 Fatty Acids (FISH OIL PO) Take by mouth.     OneTouch Delica Lancets 33G MISC As directed up to qid SUBSTITUTION PERMITTED 100 each 99   VITAMIN  E PO Take by mouth.     No current facility-administered medications for this visit.    PAST MEDICAL HISTORY: Past Medical History:  Diagnosis Date   Anemia, unspecified 04/29/2006   Benign neoplasm of skin 04/25/2007   Cardiac risk counseling 03/11/2015   Ten-year ASCVD risk 15.1% (calculated 03/11/2015) given hyperlipidemia, moderately well-controlled blood pressure, age. Prediabetes is also a concern, as well as increased BMI   Chronic right hip pain 08/18/2019   Essential hypertension, benign 08/17/2010   Former smoker    Quit 09/1988   Hyperlipidemia    Menopause    age 49   Prediabetes 03/08/2015    PAST SURGICAL HISTORY: Past Surgical History:  Procedure Laterality Date   CESAREAN SECTION     COLONOSCOPY  07/04/2006   [facility]      FAMILY HISTORY: Family   History  Problem Relation Age of Onset   Heart disease Mother 2       MI   Diabetes Mother    Glaucoma Mother    Hyperlipidemia Sister    Colon cancer Neg Hx    Colon polyps Neg Hx    Esophageal cancer Neg Hx    Rectal cancer Neg Hx    Stomach cancer Neg Hx     SOCIAL HISTORY: Social History   Socioeconomic History   Marital status: Married    Spouse name: Not on file   Number of children: Not on file   Years of education: 15   Highest education level: Some college, no degree  Occupational History   Occupation: Retired  Tobacco Use   Smoking status: Former    Types: Cigarettes    Quit date: 1990    Years since quitting: 33.5   Smokeless tobacco: Never  Vaping Use   Vaping Use: Never used  Substance and Sexual Activity   Alcohol use: Yes    Comment: occasionally   Drug use: No   Sexual activity: Not Currently  Other Topics Concern   Not on file  Social History Narrative   Not on file   Social Determinants of Health   Financial Resource Strain: Not on file  Food Insecurity: Not on file  Transportation Needs: Not on file  Physical Activity: Not on file  Stress: Not on file  Social  Connections: Not on file  Intimate Partner Violence: Not on file      Marcial Pacas, M.D. Ph.D.  [facility] [address], [place], [place] [address] Ph: [phone] Fax: [phone]  CC:  Emeterio Reeve, [place] [facility] [address] [place],  [place] [address]  Emeterio Reeve, DO

## 2021-08-24 ENCOUNTER — Other Ambulatory Visit: Payer: Self-pay | Admitting: Neurology

## 2021-09-18 ENCOUNTER — Other Ambulatory Visit: Payer: Self-pay | Admitting: Osteopathic Medicine

## 2021-09-18 DIAGNOSIS — I10 Essential (primary) hypertension: Secondary | ICD-10-CM

## 2021-09-25 ENCOUNTER — Other Ambulatory Visit: Payer: Self-pay | Admitting: Osteopathic Medicine

## 2021-09-25 DIAGNOSIS — E785 Hyperlipidemia, unspecified: Secondary | ICD-10-CM

## 2021-10-09 ENCOUNTER — Other Ambulatory Visit: Payer: Self-pay | Admitting: Osteopathic Medicine

## 2021-10-09 DIAGNOSIS — I10 Essential (primary) hypertension: Secondary | ICD-10-CM

## 2021-11-03 DIAGNOSIS — Z23 Encounter for immunization: Secondary | ICD-10-CM | POA: Diagnosis not present

## 2021-12-18 DIAGNOSIS — I10 Essential (primary) hypertension: Secondary | ICD-10-CM | POA: Diagnosis not present

## 2021-12-18 DIAGNOSIS — E1169 Type 2 diabetes mellitus with other specified complication: Secondary | ICD-10-CM | POA: Diagnosis not present

## 2021-12-18 DIAGNOSIS — E785 Hyperlipidemia, unspecified: Secondary | ICD-10-CM | POA: Diagnosis not present

## 2021-12-18 DIAGNOSIS — Z008 Encounter for other general examination: Secondary | ICD-10-CM | POA: Diagnosis not present

## 2021-12-18 DIAGNOSIS — G3184 Mild cognitive impairment, so stated: Secondary | ICD-10-CM | POA: Diagnosis not present

## 2021-12-18 DIAGNOSIS — M1990 Unspecified osteoarthritis, unspecified site: Secondary | ICD-10-CM | POA: Diagnosis not present

## 2021-12-18 DIAGNOSIS — E663 Overweight: Secondary | ICD-10-CM | POA: Diagnosis not present

## 2021-12-18 DIAGNOSIS — Z6826 Body mass index (BMI) 26.0-26.9, adult: Secondary | ICD-10-CM | POA: Diagnosis not present

## 2022-02-21 ENCOUNTER — Ambulatory Visit (INDEPENDENT_AMBULATORY_CARE_PROVIDER_SITE_OTHER): Payer: No Typology Code available for payment source | Admitting: Family Medicine

## 2022-02-21 ENCOUNTER — Encounter: Payer: Self-pay | Admitting: Family Medicine

## 2022-02-21 VITALS — BP 172/68 | HR 79 | Ht 60.0 in | Wt 156.0 lb

## 2022-02-21 DIAGNOSIS — T7840XA Allergy, unspecified, initial encounter: Secondary | ICD-10-CM | POA: Insufficient documentation

## 2022-02-21 DIAGNOSIS — I10 Essential (primary) hypertension: Secondary | ICD-10-CM | POA: Diagnosis not present

## 2022-02-21 HISTORY — DX: Allergy, unspecified, initial encounter: T78.40XA

## 2022-02-21 MED ORDER — PREDNISONE 10 MG (21) PO TBPK: ORAL_TABLET | ORAL | 0 refills | Status: DC

## 2022-02-21 NOTE — Assessment & Plan Note (Signed)
No significant pain associated with this.  Would favor allergic reaction rather than cellulitis.  Possibly related to insect bite, history is not suggestive of any other causes.  She may continue Benadryl as needed.  Adding prednisone taper.

## 2022-02-21 NOTE — Assessment & Plan Note (Addendum)
Blood pressure is elevated.  She reports she is taking her lisinopril daily.  Recommend she return in 2 weeks for blood pressure recheck.  Asked her to bring her medications with her to this visit.  Her husband does see Dr. Madilyn Fireman and she would like to establish with her since she has not reestablished with a new PCP since Dr. Sheppard Coil left.  I let her know that I would send a message.

## 2022-02-21 NOTE — Patient Instructions (Signed)
Insect Bite, Adult An insect bite can make your skin red, itchy, and swollen. Some insects can spread disease to people with a bite. However, most insect bites do not lead to disease, and most are not serious. What are the causes? Insects may bite for many reasons, including: Hunger. To defend themselves. Insects that bite include: Spiders. Mosquitoes. Flies. Ticks and fleas. Ants. Kissing bugs. Chiggers. What are the signs or symptoms? Symptoms often last for 2-4 days. However, itching can last up to 10 days. Symptoms include: Itching or pain in the bite area. Redness and swelling in the bite area. An open wound. In rare cases, a person may have a very bad allergic reaction (anaphylactic reaction) to a bite. Symptoms of an anaphylactic reaction may include: Feeling warm in the face (flushed). Your face may turn red. Itchy, red, swollen areas of skin (hives). Swelling of the eyes, lips, face, mouth, tongue, or throat. Trouble with breathing, talking, or swallowing. High-pitched whistling sounds, most often when breathing out (wheezing). Feeling dizzy or light-headed. Fainting. Pain or cramps in your belly (abdomen). Vomiting. Watery poop (diarrhea). How is this treated? Most insect bites are not serious. Symptoms often go away on their own. When treatment is advised, it may include: Putting ice on the bite area. Putting a cream or lotion, like calamine lotion, on the bite area. This helps with itching. Using medicines called antihistamines. You may also need: A tetanus shot if you are not up to date. An antibiotic cream or medicine. This treatment is needed if the bite area gets infected. Follow these instructions at home: Bite area care  Do not scratch the bite area. It may help to cover the bite area with a bandage or close-fitting clothing. Keep the bite area clean and dry. Check the bite area every day for signs of infection. Check for: More redness, swelling, or  pain. Fluid or blood. Warmth. Pus or a bad smell. Wash your hands often. Managing pain, itching, and swelling  You may put any of these on the bite area as told by your doctor: A paste made of baking soda and water. Cortisone cream. Calamine lotion. If told, put ice on the bite area. To do this: Put ice in a plastic bag. Place a towel between your skin and the bag. Leave the ice on for 20 minutes, 2-3 times a day. If your skin turns bright red, take off the ice right away to prevent skin damage. The risk of skin damage is higher if you cannot feel pain, heat, or cold. General instructions Apply or take over-the-counter and prescription medicines only as told by your doctor. If you were prescribed antibiotics, take or apply them as told by your doctor. Do not stop using them even if you start to feel better. How is this prevented? To help you have a lower risk of insect bites: When you are outside, wear clothes that cover your arms and legs. Use insect repellent. The best insect repellents contain one of these: DEET. Picaridin. Oil of lemon eucalyptus (OLE). IR3535. Consider spraying your clothing with a pesticide called permethrin. Permethrin helps prevent insect bites. It works for several weeks and for up to 5-6 clothing washes. Do not apply permethrin directly to the skin. If your home windows do not have screens, think about putting some in. If you will be sleeping in an area where there are mosquitoes, consider covering your sleeping area with a mosquito net. Contact a doctor if: You have redness, swelling, or pain   in the bite area. You have fluid or blood coming from the bite area. The bite area feels warm to the touch. You have pus or a bad smell coming from the bite area. You have a fever. Get help right away if: You have joint pain. You have a rash. You feel weak or more tired than you normally do. You have neck pain or a headache. You have signs of an anaphylactic  reaction. Signs may include: Swelling of your eyes, lips, face, mouth, tongue, or throat. Feeling warm in the face. Itchy, red, swollen areas of skin. Trouble with breathing, talking, or swallowing. Wheezing. Feeling dizzy or light-headed. Fainting. Pain or cramps in your belly. Vomiting or watery poop. These symptoms may be an emergency. Get help right away. Call 911. Do not wait to see if symptoms will go away. Do not drive yourself to the hospital. Summary An insect bite can make your skin red, itchy, and swollen. Treatment is usually not needed. Symptoms often go away on their own. Do not scratch the bite area. Keep it clean and dry. Use insect repellent to help prevent insect bites. Contact a doctor if you have signs of infection. This information is not intended to replace advice given to you by your health care provider. Make sure you discuss any questions you have with your health care provider. Document Revised: 03/21/2021 Document Reviewed: 03/21/2021 Elsevier Patient Education  2023 Elsevier Inc.  

## 2022-02-21 NOTE — Progress Notes (Signed)
Ashley Haynes - 67 y.o. female MRN [id number]  Date of birth: 13-Nov-1955  Subjective Chief Complaint  Patient presents with   Edema    Left forearm swelling started yesterday no pain but itching    Pruritis    HPI Ashley Haynes is a 67 year old female here today with complaint of left arm swelling.  She first noticed this yesterday.  She is having some redness and itching with this.  She does not recall any contact with new bath or soap products.  No new medications.  She has not tried any new foods recently.  She has tried Benadryl with some relief.  She does have history of hypertension..  She has not been seen in our clinic in greater than 1 year.  Previously was seeing Dr. Sheppard Coil.  Looking at when her last set of medications were standing she should have ran out of medications several months ago.  He does report that she is continuing to take medication, however blood pressure remains elevated.  She denies symptoms related to hypertension including chest pain, shortness of breath, palpitations, headaches or vision changes.  ROS:  A comprehensive ROS was completed and negative except as noted per HPI  No Known Allergies  Past Medical History:  Diagnosis Date   Anemia, unspecified 04/29/2006   Benign neoplasm of skin 04/25/2007   Cardiac risk counseling 03/11/2015   Ten-year ASCVD risk 15.1% (calculated 03/11/2015) given hyperlipidemia, moderately well-controlled blood pressure, age. Prediabetes is also a concern, as well as increased BMI   Chronic right hip pain 08/18/2019   Essential hypertension, benign 08/17/2010   Former smoker    Quit 09/1988   Hyperlipidemia    Menopause    age 35   Prediabetes 03/08/2015    Past Surgical History:  Procedure Laterality Date   CESAREAN SECTION     COLONOSCOPY  07/04/2006   [facility]      Social History   Socioeconomic History   Marital status: Married    Spouse name: Not on file   Number of children: Not on file   Years of  education: 15   Highest education level: Some college, no degree  Occupational History   Occupation: Retired  Tobacco Use   Smoking status: Former    Types: Cigarettes    Quit date: 1990    Years since quitting: 34.1   Smokeless tobacco: Never  Vaping Use   Vaping Use: Never used  Substance and Sexual Activity   Alcohol use: Yes    Comment: occasionally   Drug use: No   Sexual activity: Not Currently  Other Topics Concern   Not on file  Social History Narrative   Not on file   Social Determinants of Health   Financial Resource Strain: Not on file  Food Insecurity: Not on file  Transportation Needs: Not on file  Physical Activity: Not on file  Stress: Not on file  Social Connections: Not on file    Family History  Problem Relation Age of Onset   Heart disease Mother 45       MI   Diabetes Mother    Glaucoma Mother    Hyperlipidemia Sister    Colon cancer Neg Hx    Colon polyps Neg Hx    Esophageal cancer Neg Hx    Rectal cancer Neg Hx    Stomach cancer Neg Hx     Health Maintenance  Topic Date Due   Medicare Annual Wellness (AWV)  09/01/2021   COVID-19 Vaccine (  6 - 2023-24 season) 03/09/2022 (Originally 12/29/2021)   Zoster Vaccines- Shingrix (1 of 2) 05/22/2022 (Originally 11/28/2004)   MAMMOGRAM  09/22/2022   DTaP/Tdap/Td (3 - Td or Tdap) 05/26/2028   COLONOSCOPY (Pts 45-57yr Insurance coverage will need to be confirmed)  07/20/2028   Pneumonia Vaccine 67 Years old  Completed   INFLUENZA VACCINE  Completed   DEXA SCAN  Completed   Hepatitis C Screening  Completed   HPV VACCINES  Aged Out     ----------------------------------------------------------------------------------------------------------------------------------------------------------------------------------------------------------------- Physical Exam BP (!) 172/68   Pulse 79   Ht 5' (1.524 m)   Wt 156 lb (70.8 kg)   SpO2 99%   BMI 30.47 kg/m   Physical Exam Constitutional:       Appearance: Normal appearance.  HENT:     Head: Normocephalic and atraumatic.  Skin:    Comments: Erythematous patch on the left forearm.  There is erythema and warmth.  No tenderness to palpation.  Neurological:     Mental Status: She is alert.     ------------------------------------------------------------------------------------------------------------------------------------------------------------------------------------------------------------------- Assessment and Plan  Allergic reaction No significant pain associated with this.  Would favor allergic reaction rather than cellulitis.  Possibly related to insect bite, history is not suggestive of any other causes.  She may continue Benadryl as needed.  Adding prednisone taper.  Essential hypertension, benign Blood pressure is elevated.  She reports she is taking her lisinopril daily.  Recommend she return in 2 weeks for blood pressure recheck.  Asked her to bring her medications with her to this visit.  Her husband does see Dr. MMadilyn Firemanand she would like to establish with her since she has not reestablished with a new PCP since Dr. ASheppard Coilleft.  I let her know that I would send a message.   Meds ordered this encounter  Medications   predniSONE (STERAPRED UNI-PAK 21 TAB) 10 MG (21) TBPK tablet    Sig: Take as directed on packaging    Dispense:  21 tablet    Refill:  0    Return in about 2 weeks (around 03/07/2022) for Nurse visit-BP check.    This visit occurred during the SARS-CoV-2 public health emergency.  Safety protocols were in place, including screening questions prior to the visit, additional usage of staff PPE, and extensive cleaning of exam room while observing appropriate contact time as indicated for disinfecting solutions.

## 2022-03-07 ENCOUNTER — Ambulatory Visit: Payer: No Typology Code available for payment source

## 2022-03-27 ENCOUNTER — Encounter: Payer: Self-pay | Admitting: Family Medicine

## 2022-03-27 ENCOUNTER — Ambulatory Visit (INDEPENDENT_AMBULATORY_CARE_PROVIDER_SITE_OTHER): Payer: No Typology Code available for payment source | Admitting: Family Medicine

## 2022-03-27 ENCOUNTER — Telehealth: Payer: Self-pay | Admitting: Family Medicine

## 2022-03-27 VITALS — BP 173/66 | HR 63 | Ht 60.0 in | Wt 156.0 lb

## 2022-03-27 DIAGNOSIS — I10 Essential (primary) hypertension: Secondary | ICD-10-CM | POA: Diagnosis not present

## 2022-03-27 DIAGNOSIS — Z0000 Encounter for general adult medical examination without abnormal findings: Secondary | ICD-10-CM

## 2022-03-27 DIAGNOSIS — G3184 Mild cognitive impairment, so stated: Secondary | ICD-10-CM

## 2022-03-27 DIAGNOSIS — E785 Hyperlipidemia, unspecified: Secondary | ICD-10-CM | POA: Diagnosis not present

## 2022-03-27 DIAGNOSIS — R7303 Prediabetes: Secondary | ICD-10-CM | POA: Diagnosis not present

## 2022-03-27 DIAGNOSIS — D649 Anemia, unspecified: Secondary | ICD-10-CM | POA: Diagnosis not present

## 2022-03-27 MED ORDER — LISINOPRIL 30 MG PO TABS: 30.0000 mg | ORAL_TABLET | Freq: Every day | ORAL | 3 refills | Status: DC

## 2022-03-27 MED ORDER — ATORVASTATIN CALCIUM 40 MG PO TABS: 40.0000 mg | ORAL_TABLET | Freq: Every day | ORAL | 3 refills | Status: DC

## 2022-03-27 NOTE — Telephone Encounter (Signed)
Please call patient or her husband and let them know that I did review the neuropsychological testing that she had done in March 2022.  They did show some mild dysfunction at that time consistent with possible early Alzheimer's though it was not 100% confirmed.  They did recommend repeat testing in 2 to 3 years.  That way they could better compare apples to apples in regards to performance.  It has been 2 years, so if she is interested in having repeat testing I would highly encourage her to consider doing that this year.  It may take a couple of months to get an appointment which is okay but at least we can see if there have spent a significant change or decline.  If she is okay then we can place order for where she went previously.

## 2022-03-27 NOTE — Telephone Encounter (Signed)
I advised Ashley Haynes and they both agreed to go for repeat testing.

## 2022-03-27 NOTE — Assessment & Plan Note (Signed)
Pressure uncontrolled but she has been out of her medication.  New prescription sent to pharmacy encouraged her to get back on it regularly we will get updated labs.  And then plan to follow-up in 1 month to recheck pressure as well as recheck the heart murmur that was noted today.  No prior history of a heart murmur.  It could be secondary to elevated blood pressure levels.

## 2022-03-27 NOTE — Assessment & Plan Note (Addendum)
did try the Namenda but felt like it was not helpful and so is no longer taking it.  I did review back through the neuropsychiatric testing that was done in March 2022.  They did recommend repeat testing in 2 to 3 years if she continues to decline.  They suggested possible early Alzheimer's at that point in time.  Negative MRI and normal blood work.  I think it would be worth getting her back in for repeat testing to see if there has been a continued decline or significant change.  It sounds like at home they are noticing some changes.

## 2022-03-27 NOTE — Progress Notes (Signed)
Complete physical exam  Patient: Ashley Haynes   DOB: 1955/09/21   67 y.o. Female  MRN: [id number]  Subjective:    Chief Complaint  Patient presents with   Annual Exam    TOC from Dr. Kathe Mariner is a 67 y.o. female who presents today for a complete physical exam. She reports consuming a general diet. The patient does not participate in regular exercise at present. She generally feels well.  She does have additional problems to discuss today.   Also has a history of mild cognitive impairment that started probably 5 years ago around the time that she retired in 2019, but they did work it up further couple years ago.  She was evaluated at [facility] neurology.  Last visit with them was in June 2022.  Her husband who is with her here today feels like it is gradually gotten a little bit worse.  She has been having more issues with short-term memory not some much long-term memory.  MMSE in February 2022 was 29 out of 30 points.  She did have neuropsychiatric testing in April 2022 that confirmed some primary deficit surrounding encoding and retrieval aspects of memory.  They suggested possible early Alzheimer's and it was not definitive.  No history of head trauma.  Had brain MRI in March 2021 which was essentially normal.  Also recently ran out of her blood pressure medication which is why her blood pressure is high here today.   Most recent fall risk assessment:    03/27/2022    8:17 AM  [place] in the past year? 0  Number falls in past yr: 0  Injury with Fall? 0  Risk for fall due to : No Fall Risks  Follow up Falls evaluation completed     Most recent depression screenings:    03/27/2022    8:17 AM 02/21/2022    4:13 PM  PHQ 2/9 Scores  PHQ - 2 Score 0 1         No care team member to display   Outpatient Medications Prior to Visit  Medication Sig   CALCIUM PO Take by mouth.   Cholecalciferol (VITAMIN D3 PO) Take by mouth.   Flaxseed, Linseed,  (FLAX SEEDS PO) Take by mouth.   GARLIC PO Take by mouth.   latanoprost (XALATAN) 0.005 % ophthalmic solution INSTILL 1 DROP INTO BOTH EYES IN THE EVENING   MAGNESIUM PO Take by mouth.   Multiple Vitamin (MULTIVITAMIN) tablet Take 1 tablet by mouth daily.   NIACIN PO Take by mouth.   Omega-3 Fatty Acids (FISH OIL PO) Take by mouth.   VITAMIN E PO Take by mouth.   [DISCONTINUED] atorvastatin (LIPITOR) 40 MG tablet Take 1 tablet (40 mg total) by mouth daily.   [DISCONTINUED] glucose blood test strip Use up to 4 times per day as directed with glucometer. Disp: 100. Refill x99   [DISCONTINUED] glucose monitoring kit (FREESTYLE) monitoring kit GLUCOMETER PER INSURANCE PREFERENCE - SUBSTITUTION PERMITTED   [DISCONTINUED] lisinopril (ZESTRIL) 30 MG tablet Take 1 tablet (30 mg total) by mouth daily.   [DISCONTINUED] memantine (NAMENDA) 10 MG tablet TAKE 1 TABLET BY MOUTH TWICE A DAY   [DISCONTINUED] OneTouch Delica Lancets 99991111 MISC As directed up to qid SUBSTITUTION PERMITTED   [DISCONTINUED] celecoxib (CELEBREX) 200 MG capsule One to 2 tablets by mouth daily as needed for pain.   [DISCONTINUED] predniSONE (STERAPRED UNI-PAK 21 TAB) 10 MG (21) TBPK tablet Take  as directed on packaging   No facility-administered medications prior to visit.    ROS        Objective:     BP (!) 173/66   Pulse 63   Ht 5' (1.524 m)   Wt 156 lb (70.8 kg)   SpO2 99%   BMI 30.47 kg/m     Physical Exam Vitals and nursing note reviewed.  Constitutional:      Appearance: She is well-developed.  HENT:     Head: Normocephalic and atraumatic.     Right Ear: Tympanic membrane, ear canal and external ear normal.     Left Ear: Tympanic membrane, ear canal and external ear normal.     Nose: Nose normal.     Mouth/Throat:     Pharynx: Oropharynx is clear.  Eyes:     Conjunctiva/sclera: Conjunctivae normal.     Pupils: Pupils are equal, round, and reactive to light.  Neck:     Thyroid: No thyromegaly.   Cardiovascular:     Rate and Rhythm: Normal rate and regular rhythm.     Heart sounds: Murmur heard.     Comments: 1/6 high pitched systolic murmur best heard at the left sternal border.  Pulmonary:     Effort: Pulmonary effort is normal.     Breath sounds: Normal breath sounds. No wheezing.  Abdominal:     General: Bowel sounds are normal.  Musculoskeletal:     Cervical back: Neck supple.  Lymphadenopathy:     Cervical: No cervical adenopathy.  Skin:    General: Skin is warm and dry.     Coloration: Skin is not pale.  Neurological:     Mental Status: She is alert and oriented to person, place, and time.  Psychiatric:        Behavior: Behavior normal.      No results found for any visits on 03/27/22.      Assessment & Plan:    Routine Health Maintenance and Physical Exam  Immunization History  Administered Date(s) Administered   Fluad Quad(high Dose 65+) 11/03/2021   Influenza,inj,Quad PF,6+ Mos 11/30/2014, 10/28/2018   Influenza-Unspecified 10/30/2019   PFIZER(Purple Top)SARS-COV-2 Vaccination 03/09/2019, 03/31/2019, 10/30/2019, 10/26/2020   PNEUMOCOCCAL CONJUGATE-20 09/05/2020   Pfizer Covid-19 Vaccine Bivalent Booster 87yrs & up 11/03/2021   Td 05/27/2006   Tdap 05/27/2018    Health Maintenance  Topic Date Due   Medicare Annual Wellness (AWV)  09/01/2021   Zoster Vaccines- Shingrix (1 of 2) 05/22/2022 (Originally 11/28/2004)   COVID-19 Vaccine (6 - 2023-24 season) 04/12/2023 (Originally 12/29/2021)   MAMMOGRAM  09/22/2022   DTaP/Tdap/Td (3 - Td or Tdap) 05/26/2028   COLONOSCOPY (Pts 45-63yrs Insurance coverage will need to be confirmed)  07/20/2028   Pneumonia Vaccine 50+ Years old  Completed   INFLUENZA VACCINE  Completed   DEXA SCAN  Completed   Hepatitis C Screening  Completed   HPV VACCINES  Aged Out    Discussed health benefits of physical activity, and encouraged her to engage in regular exercise appropriate for her age and condition.  Problem  List Items Addressed This Visit       Cardiovascular and Mediastinum   Essential hypertension, benign    Pressure uncontrolled but she has been out of her medication.  New prescription sent to pharmacy encouraged her to get back on it regularly we will get updated labs.  And then plan to follow-up in 1 month to recheck pressure as well as recheck the heart murmur that was noted  today.  No prior history of a heart murmur.  It could be secondary to elevated blood pressure levels.      Relevant Medications   lisinopril (ZESTRIL) 30 MG tablet   atorvastatin (LIPITOR) 40 MG tablet     Other   Mild cognitive impairment     did try the Namenda but felt like it was not helpful and so is no longer taking it.  I did review back through the neuropsychiatric testing that was done in March 2022.  They did recommend repeat testing in 2 to 3 years if she continues to decline.  They suggested possible early Alzheimer's at that point in time.  Negative MRI and normal blood work.  I think it would be worth getting her back in for repeat testing to see if there has been a continued decline or significant change.  It sounds like at home they are noticing some changes.      Hyperlipidemia   Relevant Medications   lisinopril (ZESTRIL) 30 MG tablet   atorvastatin (LIPITOR) 40 MG tablet   Other Visit Diagnoses     Wellness examination    -  Primary   Relevant Orders   Lipid Panel w/reflex Direct LDL   COMPLETE METABOLIC PANEL WITH GFR   HgB A1c   CBC   TSH     Keep up a regular exercise program and make sure you are eating a healthy diet Try to eat 4 servings of dairy a day, or if you are lactose intolerant take a calcium with vitamin D daily.  Your vaccines are up to date.   Return in about 4 weeks (around 04/24/2022) for Hypertension and recheck heart murmur.     Beatrice Lecher, MD

## 2022-03-28 LAB — COMPLETE METABOLIC PANEL WITH GFR
AG Ratio: 1.2 (calc) (ref 1.0–2.5)
ALT: 21 U/L (ref 6–29)
AST: 24 U/L (ref 10–35)
Albumin: 4.4 g/dL (ref 3.6–5.1)
Alkaline phosphatase (APISO): 90 U/L (ref 37–153)
BUN: 11 mg/dL (ref 7–25)
CO2: 30 mmol/L (ref 20–32)
Calcium: 9.8 mg/dL (ref 8.6–10.4)
Chloride: 105 mmol/L (ref 98–110)
Creat: 0.78 mg/dL (ref 0.50–1.05)
Globulin: 3.6 g/dL (calc) (ref 1.9–3.7)
Glucose, Bld: 87 mg/dL (ref 65–99)
Potassium: 4.3 mmol/L (ref 3.5–5.3)
Sodium: 143 mmol/L (ref 135–146)
Total Bilirubin: 0.5 mg/dL (ref 0.2–1.2)
Total Protein: 8 g/dL (ref 6.1–8.1)
eGFR: 83 mL/min/{1.73_m2} (ref >=60)

## 2022-03-28 LAB — LIPID PANEL W/REFLEX DIRECT LDL
Cholesterol: 214 mg/dL — ABNORMAL HIGH (ref <200)
HDL: 72 mg/dL (ref >=50)
LDL Cholesterol (Calc): 120 mg/dL (calc) — ABNORMAL HIGH
Non-HDL Cholesterol (Calc): 142 mg/dL (calc) — ABNORMAL HIGH (ref <130)
Total CHOL/HDL Ratio: 3 (calc) (ref <5.0)
Triglycerides: 117 mg/dL (ref <150)

## 2022-03-28 LAB — CBC
HCT: 46.8 % — ABNORMAL HIGH (ref 35.0–45.0)
Hemoglobin: 15.5 g/dL (ref 11.7–15.5)
MCH: 28 pg (ref 27.0–33.0)
MCHC: 33.1 g/dL (ref 32.0–36.0)
MCV: 84.6 fL (ref 80.0–100.0)
MPV: 9.5 fL (ref 7.5–12.5)
Platelets: 285 10*3/uL (ref 140–400)
RBC: 5.53 10*6/uL — ABNORMAL HIGH (ref 3.80–5.10)
RDW: 13.6 % (ref 11.0–15.0)
WBC: 4.3 10*3/uL (ref 3.8–10.8)

## 2022-03-28 LAB — HEMOGLOBIN A1C
Hgb A1c MFr Bld: 5.9 % of total Hgb — ABNORMAL HIGH (ref <5.7)
Mean Plasma Glucose: 123 mg/dL
eAG (mmol/L): 6.8 mmol/L

## 2022-03-28 LAB — TSH: TSH: 1.14 mIU/L (ref 0.40–4.50)

## 2022-03-28 NOTE — Progress Notes (Signed)
Hi Ashley Haynes, your LDL cholesterol is up and your overall 10 yr risk for heart disease is >10% ( is is 19.7%). this puts you at extremely high risk for stroke and heart attack. I think we need to get your cholesterol lower.  Would you be oK with upping your statin or changing it?  I would like to get you r LDL under 100. All other labs look OK>   The 10-year ASCVD risk score (Arnett DK, et al., 2019) is: 19.7%   Values used to calculate the score:     Age: 67 years     Sex: Female     Is Non-Hispanic African American: Yes     Diabetic: No     Tobacco smoker: No     Systolic Blood Pressure: [id number] mmHg     Is BP treated: Yes     HDL Cholesterol: 72 mg/dL     Total Cholesterol: 214 mg/dL

## 2022-03-28 NOTE — Telephone Encounter (Signed)
Orders Placed This Encounter  Procedures   Ambulatory referral to Neurology    Referral Priority:   Routine    Referral Type:   Consultation    Referral Reason:   Specialty Services Required    Requested Specialty:   Neurology    Number of Visits Requested:   1    

## 2022-03-30 NOTE — Progress Notes (Signed)
HI Ashley Haynes. You can take 2 tabs of your current cholesterol pill . You will run out more quickly but we can send in a new Rx for the 80mg  when you get low on your current supply.

## 2022-04-02 ENCOUNTER — Telehealth: Payer: Self-pay | Admitting: *Deleted

## 2022-04-02 NOTE — Telephone Encounter (Signed)
Pt called and lvm stating that 2 days after starting the "new" medication she began to have diarrhea and she is still having diarrhea now.  I called her back she stated that Dr. Madilyn Fireman increased the Atorvastatin to 40 mg.  She reports that at first it was 2 episodes. Yesterday it was 3x and watery.  She hasn't had any recent changes to her diet.  Will fwd to pcp for advice.

## 2022-04-04 ENCOUNTER — Telehealth: Payer: Self-pay | Admitting: Family Medicine

## 2022-04-04 NOTE — Telephone Encounter (Signed)
Can you check on neuro referral. Pt has heard back yet

## 2022-04-05 NOTE — Telephone Encounter (Signed)
K to hold the tab for the next week until the diarrhea improves.  Then see if she would be willing to retry it again maybe in a week or 2 and see if it does the same thing.  When to make sure that the diarrhea clears up.  I just want to make sure that it is not coincidental and that it really is the medication doing it.  If it is the medicine that I am happy to switch it to something different.

## 2022-04-05 NOTE — Telephone Encounter (Signed)
Sent referral to [facility] Neurology. Patient has already scheduled appointments in [place].

## 2022-04-09 NOTE — Telephone Encounter (Signed)
Patient advised.

## 2022-04-17 ENCOUNTER — Other Ambulatory Visit: Payer: Self-pay | Admitting: Pharmacist

## 2022-04-17 NOTE — Progress Notes (Signed)
04/17/2022 Name: RITAMARIE HAUGER MRN: [id number] DOB: [birth year]    Ashley Haynes is a 67 y.o. year old female who presented for a telephone visit.   They were referred to the pharmacist by a quality report for assistance in managing  quality metrics: med adherence cholesterol (MAC), med adherence hypertension [facility]) .    Subjective:  Care Team: Primary Care Provider: No primary care provider on file. - seen by Dr. Linford Arnold ; Next Scheduled Visit: 04/24/22   Medication Access/Adherence  Current Pharmacy:  [id number] - [place], [place] - [address] [place] [address] Phone: [phone] Fax: [phone]   Patient reports affordability concerns with their medications: No  Patient reports access/transportation concerns to their pharmacy: No  Patient reports adherence concerns with their medications:  No  but there was a brief lapse in medications due to PCP change   Hypertension:  Current medications: lisinopril 30mg  daily   Hyperlipidemia/ASCVD Risk Reduction  Current lipid lowering medications:  atorvastatin 40mg  daily   Objective:  Lab Results  Component Value Date   HGBA1C 5.9 (H) 03/27/2022    Lab Results  Component Value Date   CREATININE 0.78 03/27/2022   BUN 11 03/27/2022   NA 143 03/27/2022   K 4.3 03/27/2022   CL 105 03/27/2022   CO2 30 03/27/2022    Lab Results  Component Value Date   CHOL 214 (H) 03/27/2022   HDL 72 03/27/2022   LDLCALC 120 (H) 03/27/2022   TRIG 117 03/27/2022   CHOLHDL 3.0 03/27/2022    Medications Reviewed Today     Reviewed by Gabriel Carina, RPH (Pharmacist) on 04/17/22 at 1548  Med List Status: <None>   Medication Order Taking? Sig Documenting Provider Last Dose Status Informant  atorvastatin (LIPITOR) 40 MG tablet [id number] Yes Take 1 tablet (40 mg total) by mouth at bedtime. Agapito Games, MD Taking Active   CALCIUM PO [id number] Yes Take by mouth.  [provider] Taking Active   Cholecalciferol (VITAMIN D3 PO) [id number] No Take by mouth.  Patient not taking: Reported on 04/17/2022   [provider] Not Taking Active   Flaxseed, Linseed, (FLAX SEEDS PO) [id number] Yes Take by mouth. [provider] Taking Active   GARLIC PO [id number] Yes Take by mouth. [provider] Taking Active   latanoprost (XALATAN) 0.005 % ophthalmic solution [id number] No INSTILL 1 DROP INTO BOTH EYES IN THE EVENING  Patient not taking: Reported on 04/17/2022   [provider] Not Taking Active   lisinopril (ZESTRIL) 30 MG tablet [id number] Yes Take 1 tablet (30 mg total) by mouth daily. Agapito Games, MD Taking Active   MAGNESIUM PO [id number] Yes Take by mouth. [provider] Taking Active   Multiple Vitamin (MULTIVITAMIN) tablet [id number] Yes Take 1 tablet by mouth daily. [provider] Taking Active            Med Note Karsten Fells Apr 17, 2022  3:46 PM) Spectravite  NIACIN PO [id number] No Take by mouth.  Patient not taking: Reported on 04/17/2022   [provider] Not Taking Active   Omega-3 Fatty Acids (FISH OIL PO) [id number] Yes Take by mouth. [provider] Taking Active   VITAMIN E PO [id number] No Take by mouth.  Patient not taking: Reported on 04/17/2022   [provider] Not Taking Active  Assessment/Plan:   Hypertension: - Currently unknown control, patient is taking medication regularly, brief lapse in fill history last year due to PCP change, now resolved. - Recommend to continue current regimen, if BP remains elevated, may consider increase to lisinopril 40mg  daily    Hyperlipidemia/ASCVD Risk Reduction: - Currently unknown control, patient is taking medication regularly, brief lapse in fill history last year due to PCP change, now resolved. - Reviewed long term complications of uncontrolled cholesterol - Recommend to  continue current regimen   Ashley Haynes, PharmD, BCPS Clinical Pharmacist [facility]

## 2022-04-24 ENCOUNTER — Ambulatory Visit (INDEPENDENT_AMBULATORY_CARE_PROVIDER_SITE_OTHER): Payer: No Typology Code available for payment source | Admitting: Family Medicine

## 2022-04-24 ENCOUNTER — Telehealth: Payer: Self-pay | Admitting: Family Medicine

## 2022-04-24 ENCOUNTER — Encounter: Payer: Self-pay | Admitting: Family Medicine

## 2022-04-24 VITALS — BP 128/50 | HR 69 | Ht 60.0 in | Wt 158.0 lb

## 2022-04-24 DIAGNOSIS — G3184 Mild cognitive impairment, so stated: Secondary | ICD-10-CM

## 2022-04-24 DIAGNOSIS — I10 Essential (primary) hypertension: Secondary | ICD-10-CM | POA: Diagnosis not present

## 2022-04-24 MED ORDER — LISINOPRIL-HYDROCHLOROTHIAZIDE 20-12.5 MG PO TABS: 1.0000 | ORAL_TABLET | Freq: Every day | ORAL | 0 refills | Status: DC

## 2022-04-24 NOTE — Telephone Encounter (Signed)
Please call [facility] and see how long they are booked out for neuropsychiatric testing for memory impairment.  It would probably be through the [facility].  Noel Gerold is booked out until November but I want to see how far out [facility] is booked.  I have not placed an official referral yet.

## 2022-04-24 NOTE — Patient Instructions (Signed)
Schedule your Medicare wellness exam with our Medicare wellness nurse [place].

## 2022-04-24 NOTE — Telephone Encounter (Signed)
Contacted [facility] Neuropsychology department and was advised the New patient evaluations currently have a wait list of 6-9 months from the time the referral is received.   Spoke with Marcelle Smiling with [facility] to ask about Neuropsych testing and she states the testing is no longer done at the [facility]

## 2022-04-24 NOTE — Progress Notes (Signed)
   Established Patient Office Visit  Subjective   Patient ID: Ashley Haynes, female    DOB: 1955-03-29  Age: 67 y.o. MRN: [id number]  Chief Complaint  Patient presents with   Hypertension   Heart Murmur    HPI  Hypertension- Pt denies chest pain, SOB, dizziness, or heart palpitations.  Taking meds as directed w/o problems.  Denies medication side effects.  Ports home blood pressures are running in the 150s.  Here today for repeat blood pressure check currently on lisinopril 30 mg.  She was able to connect to get scheduled for the neuropsychiatric testing for memory.  But unfortunately they are booked out till November and she wanted to know what her options are.     ROS    Objective:     BP 136/65   Pulse 82   Ht 5' (1.524 m)   Wt 158 lb (71.7 kg)   SpO2 97%   BMI 30.86 kg/m    Physical Exam Vitals and nursing note reviewed.  Constitutional:      Appearance: She is well-developed.  HENT:     Head: Normocephalic and atraumatic.  Cardiovascular:     Rate and Rhythm: Normal rate and regular rhythm.     Heart sounds: Normal heart sounds.  Pulmonary:     Effort: Pulmonary effort is normal.     Breath sounds: Normal breath sounds.  Skin:    General: Skin is warm and dry.  Neurological:     Mental Status: She is alert and oriented to person, place, and time.  Psychiatric:        Behavior: Behavior normal.      No results found for any visits on 04/24/22.    The 10-year ASCVD risk score (Arnett DK, et al., 2019) is: 12%    Assessment & Plan:   Problem List Items Addressed This Visit       Cardiovascular and Mediastinum   Essential hypertension, benign - Primary    Pressure much improved.  Almost at goal.  Still getting 130s and 140s at home as well.  Will switch to lisinopril HCTZ combination I will have to decrease lisinopril slightly just because it does not, the 30 mg dose but we can see if the combination has a bigger impact and gets her blood  pressures more consistently in the 120s.  She need to go for BMP in about 2 to 3 weeks after switching medications and otherwise plan to follow-up in about 8 weeks for blood pressure check.      Relevant Medications   lisinopril-hydrochlorothiazide (ZESTORETIC) 20-12.5 MG tablet   Other Relevant Orders   BASIC METABOLIC PANEL WITH GFR     Other   Mild cognitive impairment    Discussed options including trying to get on a wait list.  We could also check [facility] to see what their availability is.  We could also consider a trial of medication.  She had tried 1 previously and did not do well but we could try something different.  She will think on it and let me know but she is can I definitely try to get him in on the wait list.       Return in about 2 months (around 06/24/2022) for Hypertension.    Nani Gasser, MD

## 2022-04-24 NOTE — Assessment & Plan Note (Signed)
Pressure much improved.  Almost at goal.  Still getting 130s and 140s at home as well.  Will switch to lisinopril HCTZ combination I will have to decrease lisinopril slightly just because it does not, the 30 mg dose but we can see if the combination has a bigger impact and gets her blood pressures more consistently in the 120s.  She need to go for BMP in about 2 to 3 weeks after switching medications and otherwise plan to follow-up in about 8 weeks for blood pressure check.

## 2022-04-24 NOTE — Telephone Encounter (Signed)
OK, Thank you.  Please notify patient and see if she would like me to place a referral to them or not.

## 2022-04-24 NOTE — Assessment & Plan Note (Signed)
Discussed options including trying to get on a wait list.  We could also check [facility] to see what their availability is.  We could also consider a trial of medication.  She had tried 1 previously and did not do well but we could try something different.  She will think on it and let me know but she is can I definitely try to get him in on the wait list.

## 2022-04-25 NOTE — Telephone Encounter (Signed)
Spoke with Ms. Montez Morita and she would like the referral to be placed for Neuropsych testing at [facility] Neuropsychology department. Please advise.

## 2022-04-25 NOTE — Telephone Encounter (Signed)
04/25/2022-Left message on patients voicemail to contact office to discuss Neuropsych testing at [facility].

## 2022-04-25 NOTE — Telephone Encounter (Signed)
Orders Placed This Encounter  Procedures   Ambulatory referral to Neuropsychology    Referral Priority:   Routine    Referral Type:   Psychiatric    Referral Reason:   Specialty Services Required    Requested Specialty:   Psychology    Number of Visits Requested:   1

## 2022-04-26 NOTE — Telephone Encounter (Signed)
Referral, clinical notes and copies of insurance cards have been faxed to [facility] Neuropsychology at [phone]. Office will contact patient to schedule referral appointment.

## 2022-05-07 DIAGNOSIS — H52223 Regular astigmatism, bilateral: Secondary | ICD-10-CM | POA: Diagnosis not present

## 2022-05-07 DIAGNOSIS — H524 Presbyopia: Secondary | ICD-10-CM | POA: Diagnosis not present

## 2022-05-07 DIAGNOSIS — H401131 Primary open-angle glaucoma, bilateral, mild stage: Secondary | ICD-10-CM | POA: Diagnosis not present

## 2022-05-23 DIAGNOSIS — E1169 Type 2 diabetes mellitus with other specified complication: Secondary | ICD-10-CM | POA: Diagnosis not present

## 2022-05-23 DIAGNOSIS — E669 Obesity, unspecified: Secondary | ICD-10-CM | POA: Diagnosis not present

## 2022-05-23 DIAGNOSIS — Z008 Encounter for other general examination: Secondary | ICD-10-CM | POA: Diagnosis not present

## 2022-05-23 DIAGNOSIS — E1122 Type 2 diabetes mellitus with diabetic chronic kidney disease: Secondary | ICD-10-CM | POA: Diagnosis not present

## 2022-05-23 DIAGNOSIS — I129 Hypertensive chronic kidney disease with stage 1 through stage 4 chronic kidney disease, or unspecified chronic kidney disease: Secondary | ICD-10-CM | POA: Diagnosis not present

## 2022-05-23 DIAGNOSIS — E785 Hyperlipidemia, unspecified: Secondary | ICD-10-CM | POA: Diagnosis not present

## 2022-05-23 DIAGNOSIS — Z6830 Body mass index (BMI) 30.0-30.9, adult: Secondary | ICD-10-CM | POA: Diagnosis not present

## 2022-05-23 DIAGNOSIS — N182 Chronic kidney disease, stage 2 (mild): Secondary | ICD-10-CM | POA: Diagnosis not present

## 2022-06-30 ENCOUNTER — Other Ambulatory Visit: Payer: Self-pay | Admitting: Family Medicine

## 2022-06-30 DIAGNOSIS — I10 Essential (primary) hypertension: Secondary | ICD-10-CM

## 2022-07-03 ENCOUNTER — Encounter: Payer: Self-pay | Admitting: Psychology

## 2022-07-04 ENCOUNTER — Ambulatory Visit: Payer: No Typology Code available for payment source | Admitting: Psychology

## 2022-07-04 ENCOUNTER — Ambulatory Visit (INDEPENDENT_AMBULATORY_CARE_PROVIDER_SITE_OTHER): Payer: No Typology Code available for payment source | Admitting: Psychology

## 2022-07-04 ENCOUNTER — Encounter: Payer: Self-pay | Admitting: Psychology

## 2022-07-04 DIAGNOSIS — G3184 Mild cognitive impairment, so stated: Secondary | ICD-10-CM

## 2022-07-04 DIAGNOSIS — R4189 Other symptoms and signs involving cognitive functions and awareness: Secondary | ICD-10-CM

## 2022-07-04 NOTE — Progress Notes (Signed)
   Psychometrician Note   Cognitive testing was administered to Ashley Haynes by Wallace Keller, B.S. (psychometrist) under the supervision of Dr. Newman Nickels, Ph.D., licensed psychologist on 07/04/2022. Ms. Hudock did not appear overtly distressed by the testing session per behavioral observation or responses across self-report questionnaires. Rest breaks were offered.    The battery of tests administered was selected by Dr. Newman Nickels, Ph.D. with consideration to Ms. Everett's current level of functioning, the nature of her symptoms, emotional and behavioral responses during interview, level of literacy, observed level of motivation/effort, and the nature of the referral question. This battery was communicated to the psychometrist. Communication between Dr. Newman Nickels, Ph.D. and the psychometrist was ongoing throughout the evaluation and Dr. Newman Nickels, Ph.D. was immediately accessible at all times. Dr. Newman Nickels, Ph.D. provided supervision to the psychometrist on the date of this service to the extent necessary to assure the quality of all services provided.    SAPHIRA LAHMANN will return within approximately 1-2 weeks for an interactive feedback session with Dr. Milbert Coulter at which time her test performances, clinical impressions, and treatment recommendations will be reviewed in detail. Ms. Lucarelli understands she can contact our office should she require our assistance before this time.  A total of 140 minutes of billable time were spent face-to-face with Ms. Barthel by the psychometrist. This includes both test administration and scoring time. Billing for these services is reflected in the clinical report generated by Dr. Newman Nickels, Ph.D.  This note reflects time spent with the psychometrician and does not include test scores or any clinical interpretations made by Dr. Milbert Coulter. The full report will follow in a separate note.

## 2022-07-04 NOTE — Progress Notes (Unsigned)
NEUROPSYCHOLOGICAL EVALUATION [place] [place] Department of Neurology  Date of Evaluation: July 04, 2022  Reason for Referral:   Ashley Haynes is a 67 y.o. right-handed African-American female referred by  Nani Gasser, M.D. , to characterize her current cognitive functioning and assist with diagnostic clarity and treatment planning in the context of a prior mild neurocognitive disorder diagnosis of uncertain etiology and concern for progressive cognitive decline.   Assessment and Plan:   Clinical Impression(s): Scores across stand-alone and embedded performance validity measures were variable but largely within expectation. There remains the potential that her sole below expectation performance is due to true memory impairment. Overall, test performances should likely be interpreted with a mild degree of caution given this result.  If taken at face value, Ashley Haynes's pattern of performance is suggestive of an isolated impairment surrounding all aspects of verbal learning and memory. Performances were appropriate relative to age-matched peers across all other assessed cognitive domains. This includes processing speed, attention/concentration, executive functioning, receptive and expressive language, visuospatial abilities, and visual learning and memory. Ashley Haynes denied difficulties completing instrumental activities of daily living (ADLs) independently. As such, given evidence for cognitive dysfunction described above, she continues to best meet diagnostic criteria for a Mild Neurocognitive Disorder ("mild cognitive impairment") at the present time.  Relative to her previous evaluation in March 2022, there was general stability across all assessed cognitive domains. Subtle decline could be observed across retention rates for story and shape-based learning tasks (90% - 125% in 2022 versus 66% - 71% currently), as well as a slightly increased repetition rate  across a semantic fluency set shifting task. However, as a whole, similar patterns were present across both evaluations. Slight improvements could be argued across complex attention and her drawing of a complex figure.   The etiology of isolated verbal memory impairment continues to be unclear. Verbal memory impairment can elevate concerns surrounding an underlying neurodegenerative illness such as Alzheimer's disease. Ashley Haynes age is young relative to when this illness would normally become symptomatic and performances across visual memory and other non-memory areas commonly implicated early on with this illness were appropriate across current testing. This, coupled with relative stability over the past two years, is encouraging. While this illness in extremely early stages cannot be ruled out, it certainly should not be ruled in with any degree of confidence currently.  Recommendations: A repeat neuropsychological evaluation in 24 months (or sooner if functional decline is noted) is recommended to assess the trajectory of future cognitive decline should it occur. This will also aid in future efforts towards improved diagnostic clarity.  If further diagnostic clarity is desired, she could discuss the pros and cons of a lumbar puncture or FDG-PET scan. While these are not diagnostic of Alzheimer's disease in isolation, they can be helpful tools to understand relative risk. Likewise, a DaTscan or alpha-synuclein skin biopsy could provide useful information surrounding concerns for a parkinsonian condition or something like essential tremor given ongoing tremulous experiences.   Ashley Haynes is encouraged to attend to lifestyle factors for brain health (e.g., regular physical exercise, good nutrition habits and consideration of the MIND-DASH diet, regular participation in cognitively-stimulating activities, and general stress management techniques), which are likely to have benefits for both emotional  adjustment and cognition. Optimal control of vascular risk factors (including safe cardiovascular exercise and adherence to dietary recommendations) is encouraged. Continued participation in activities which provide mental stimulation and social interaction is also recommended.   Memory can  be improved using internal strategies such as rehearsal, repetition, chunking, mnemonics, association, and imagery. External strategies such as written notes in a consistently used memory journal, visual and nonverbal auditory cues such as a calendar on the refrigerator or appointments with alarm, such as on a cell phone, can also help maximize recall.    When learning new information, she would benefit from information being broken up into small, manageable pieces. she may also find it helpful to articulate the material in her own words and in a context to promote encoding at the onset of a new task. This material may need to be repeated multiple times to promote encoding.  Because she shows better recall for structured information, she will likely understand and retain new information better if it is presented to her in a meaningful or well-organized manner at the outset, such as grouping items into meaningful categories or presenting information in an outlined, bulleted, or story format.  To address problems with fluctuating attention and/or executive dysfunction, she may wish to consider:   -Avoiding external distractions when needing to concentrate   -Limiting exposure to fast paced environments with multiple sensory demands   -Writing down complicated information and using checklists   -Attempting and completing one task at a time (i.e., no multi-tasking)   -Verbalizing aloud each step of a task to maintain focus   -Taking frequent breaks during the completion of steps/tasks to avoid fatigue   -Reducing the amount of information considered at one time   -Scheduling more difficult activities for a time of day  where she is usually most alert  Review of Records:   Ashley Haynes was seen by [facility] Margie Ege, NP) on 02/18/2020 for an evaluation of memory loss and a right hand tremor. Specific to the former, she described sometimes walking down the hallway and forgetting why she was there. However, after a while, information generally comes back to her. She also reported misplacing things at times and having trouble recalling recipes. She keeps a calendar of appointments. She reportedly drives well. She once got lost in downtown [place]; however, she was able to utilize her GPS and got home without incident. Memory dysfunction was said to start around the time of her retirement in 2019. She denied that her retirement was due to difficulties completing job responsibilities. She noted some trouble with sleep, stating that she will wake up multiple times to use the restroom and then may have trouble falling back asleep. Performance on a brief cognitive screening instrument (MMSE) was 29/30. Ultimately, Ashley Haynes was referred for a comprehensive neuropsychological evaluation to characterize her cognitive abilities and to assist with diagnostic clarity and treatment planning.   She completed a comprehensive neuropsychological evaluation with myself on 04/07/2020. Variability across validity testing limited test interpretation. If taken at face value, results suggested primary deficits surrounding encoding (i.e., learning) and retrieval aspects of memory. Retention rates were variable (ranging from 0 to 125%) while performance on yes/no recognition trials was consistently below expectation. She also performed poorly across a computerized task assessing hypothesis testing and adaptability (WCST). However, other aspects of executive functioning were within normal limits relative to age-matched peers. ADLs were described as intact. She was ultimately diagnosed with a mild neurocognitive disorder. The  underlying etiology was unclear at that time. Repeat testing was recommended.   She most recently met with [facility] Levert Feinstein, M.D.) on 08/01/2021 for follow-up. She had trialed donepezil in the interim but discontinued due to emergent insomnia.  She had been placed on memantine. Ashley Haynes described ongoing memory dysfunction with concerns surrounding progressive decline. ADLs had remained intact. Performance on a brief cognitive screening instrument (MOCA) was 20/30. Ultimately, Ashley Haynes was referred for a repeat neuropsychological evaluation to characterize her cognitive abilities and to assist with diagnostic clarity and treatment planning.    A prior EEG was said to be normal. Brain MRI on 03/16/2019 was unremarkable. No other neuroimaging was available for review.   Past Medical History:  Diagnosis Date   Allergic reaction 02/21/2022   Anemia, unspecified 04/29/2006   Benign neoplasm of skin 04/25/2007   Cardiac risk counseling 03/11/2015   Ten-year ASCVD risk 15.1% (calculated 03/11/2015) given hyperlipidemia, moderately well-controlled blood pressure, age. Prediabetes is also a concern, as well as increased BMI   Chronic right hip pain 08/18/2019   Essential hypertension, benign 08/17/2010   Former smoker    Quit 09/1988   Hyperlipidemia    Mild cognitive impairment with memory loss 04/07/2020   Prediabetes 03/08/2015   Symptomatic menopausal or female climacteric states 04/29/2006   Centricity Description: HOT FLASHES    Past Surgical History:  Procedure Laterality Date   CESAREAN SECTION     COLONOSCOPY  07/04/2006   [facility]      Current Outpatient Medications:    atorvastatin (LIPITOR) 40 MG tablet, Take 1 tablet (40 mg total) by mouth at bedtime., Disp: 90 tablet, Rfl: 3   CALCIUM PO, Take by mouth., Disp: , Rfl:    Flaxseed, Linseed, (FLAX SEEDS PO), Take by mouth., Disp: , Rfl:    GARLIC PO, Take by mouth., Disp: , Rfl:     lisinopril-hydrochlorothiazide (ZESTORETIC) 20-12.5 MG tablet, TAKE 1 TABLET BY MOUTH EVERY DAY, Disp: 90 tablet, Rfl: 0   MAGNESIUM PO, Take by mouth., Disp: , Rfl:    Multiple Vitamin (MULTIVITAMIN) tablet, Take 1 tablet by mouth daily., Disp: , Rfl:    Omega-3 Fatty Acids (FISH OIL PO), Take by mouth., Disp: , Rfl:   Clinical Interview:   The following information was obtained during a clinical interview with Ashley Haynes and her husband prior to cognitive testing.  Cognitive Symptoms: Decreased short-term memory: Endorsed. She previously reported trouble recalling the details of previous conversations. However, information was said to generally come to her with time or with the provision of some sort of reminder. Medical records also suggest some concerns surrounding her misplacing objects or entering locations and forgetting her original intention. Overall, she reported feeling as though she could not "trust [her] memory" any longer. Currently, she described similar concerns, noting her perception of progressive decline over time. Her husband was in agreement. He also described increased repetition in conversation, with concern for information being lost relatively rapidly. Overall, difficulties were said to have been present for the past 4-5 years. Decreased long-term memory: Denied. Decreased attention/concentration: Denied. Reduced processing speed: Denied. Difficulties with executive functions: Denied. She and her husband denied trouble with impulsivity or any severe personality changes. He did describe his observation of fairly subtle to mild personality changes in that Ashley Haynes may react to situations in a different way or speak about something in an unexpected or more simple manner.  Difficulties with emotion regulation: Denied. Difficulties with receptive language: Denied. Difficulties with word finding: Denied. Decreased visuoperceptual ability: Denied.   Difficulties completing  ADLs: Denied. She manages her medications and reported driving without noted difficulty. Her husband manages finances, which is longstanding in nature.   Additional Medical History: History of traumatic brain  injury/concussion: Denied. History of stroke: Denied. History of seizure activity: Denied. History of known exposure to toxins: Denied. Symptoms of chronic pain: Denied.  Experience of frequent headaches/migraines: Denied. Frequent instances of dizziness/vertigo: Denied.   Sensory changes: She wears glasses with positive effect and noted some very mild hearing difficulties only when there is significant background noise. Other sensory changes/difficulties (e.g., taste or smell) were denied.  Balance/coordination difficulties: Denied. She also denied any recent falls.  Other motor difficulties: Endorsed. She reported a mild tremor affecting her right hand. Symptoms were only said to be present while attempting to write or paint. She previously described her handwriting as being far sloppier than in the past. She denied experiencing tremor symptoms while performing other fine motor actions like holding a cup of coffee. These were said to be present for the past several years and have more or less remained stable over time.   Sleep History: Estimated hours obtained each night: 8 hours.  Difficulties falling asleep: Denied. Difficulties staying asleep: Denied outside of waking to use the restroom.  Feels rested and refreshed upon awakening: Endorsed.  History of snoring: Endorsed. History of waking up gasping for air: Denied. Witnessed breath cessation while asleep: Denied.  History of vivid dreaming: Denied. Excessive movement while asleep: Unclear. Her husband reported instances where she might mildly kick or shake her legs while asleep. These were said to be quite infrequent, quite mild, and ongoing for the past few years without worsening.  Instances of acting out her dreams:  Denied.  Psychiatric/Behavioral Health History: Depression: She described her current mood as "sometimes sad." This was attributed to unspecified day-to-day stressors, as well as concern surrounding memory decline. She denied to her knowledge ever being diagnosed with a mental health condition in the past. Current or remote suicidal ideation, intent, or plan was denied.  Anxiety: Denied. Mania: Denied. Trauma History: Denied. Visual/auditory hallucinations: Denied. Delusional thoughts: Denied.   Tobacco: Denied. Alcohol: She reported previously infrequent alcohol consumption generally with dinner or in social settings. She denied a history of problematic alcohol abuse or dependence.  Recreational drugs: Denied.  Family History: Problem Relation Age of Onset   Heart disease Mother 50       MI   Diabetes Mother    Glaucoma Mother    Hyperlipidemia Sister    Colon cancer Neg Hx    Colon polyps Neg Hx    Esophageal cancer Neg Hx    Rectal cancer Neg Hx    Stomach cancer Neg Hx    This information was confirmed by Ms. Montez Morita.  Academic/Vocational History: Highest level of educational attainment: 15 years. She graduated from high school and completed three additional years of college. She described herself as a strong (A/B) student in academic settings. No relative weaknesses during early educational settings were identified.  History of developmental delay: Denied. History of grade repetition: Denied. Enrollment in special education courses: Denied. History of LD/ADHD: Denied.   Employment: Retired. 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   Evaluation Results:   Behavioral Observations: Ms. Shadd was accompanied by her husband, arrived to her appointment on time, and was appropriately dressed and groomed. She appeared alert. Observed gait and station were within normal limits. Gross motor functioning appeared intact upon informal observation and no  abnormal movements (e.g., tremors) were noted. Her affect was generally relaxed and positive. Spontaneous speech was fluent and word finding difficulties were not observed during the clinical interview. Thought processes were coherent, organized,  and normal in content. Insight into her cognitive difficulties appeared adequate.   During testing, tremors were quite pronounced during motorized tasks (i.e., Coding, Trails, Figure Drawing). No jerking behaviors were observed as they were during her previous evaluation. Sustained attention was appropriate. Task engagement was adequate and she persisted when challenged. She did fatigue as the evaluation progressed. Her husband also noted needing to be at another location at a specific time this afternoon. As such, the evaluation was slightly abbreviated in response. Overall, Ashley Haynes was cooperative with the clinical interview and subsequent testing procedures.   Adequacy of Effort: The validity of neuropsychological testing is limited by the extent to which the individual being tested may be assumed to have exerted adequate effort during testing. Ashley Haynes expressed her intention to perform to the best of her abilities and exhibited adequate task engagement and persistence. Scores across stand-alone and embedded performance validity measures were variable but largely within expectation. There remains the potential that her sole below expectation performance is due to true memory impairment. Overall, test performances should likely be interpreted with a mild degree of caution given this result.  Test Results: Ashley Haynes was mildly disoriented at the time of the current evaluation. She was unable to state her age ("56 something") nor the current date.   Intellectual abilities based upon educational and vocational attainment were estimated to be in the average range. Premorbid abilities were estimated to be within the upper limits of the below average range  based upon a single-word reading test.   Processing speed was below average to average. Basic attention was average. More complex attention (e.g., working memory) was also average. Executive functioning was mildly variable but overall appropriate, ranging from the below average to above average normative ranges.  Assessed receptive language abilities were above average. Likewise, Ashley Haynes did not exhibit any difficulties comprehending task instructions and answered all questions asked of her appropriately. Assessed expressive language (e.g., verbal fluency and confrontation naming) was average.     Assessed visuospatial/visuoconstructional abilities were average to well above average.    Learning (i.e., encoding) of novel information was average across a shape learning task but well below average to below average across all verbal tasks. Spontaneous delayed recall (i.e., retrieval) of previously learned information was average across a shape learning task but exceptionally low to well below average across all verbal tasks. Retention rates were 66% across a story learning task, 0% across a list learning task, 53% across a daily living task, and 71% across a shape learning task. Performance across recognition tasks was exceptionally low to below average, suggesting very limited evidence for information consolidation.  Results of emotional screening instruments suggested that recent symptoms of generalized anxiety were in the minimal range, while symptoms of depression were within normal limits. A screening instrument assessing recent sleep quality suggested the presence of minimal sleep dysfunction.  Tables of Scores:   Note: This summary of test scores accompanies the interpretive report and should not be considered in isolation without reference to the appropriate sections in the text. Descriptors are based on appropriate normative data and may be adjusted based on clinical judgment. Terms such as  "Within Normal Limits" and "Outside Normal Limits" are used when a more specific description of the test score cannot be determined. Descriptors refer to the current evaluation only.         Percentile - Normative Descriptor > 98 - Exceptionally High 91-97 - Well Above Average 75-90 - Above Average 25-74 - Average  9-24 - Below Average 2-8 - Well Below Average < 2 - Exceptionally Low        Validity: March 2022 Current  DESCRIPTOR  ACS Word Choice: --- --- --- Outside Normal Limits  Dot Counting Test: --- --- --- Within Normal Limits  NAB EVI: --- --- --- Within Normal Limits  D-KEFS Color Word Effort Index: --- --- --- Within Normal Limits        Orientation:       Raw Score Raw Score Percentile   NAB Orientation, Form 1 27/29 24/29 --- ---        Cognitive Screening:       Raw Score Raw Score Percentile   SLUMS: 20/30 13/30 --- ---        Intellectual Functioning:       Standard Score Standard Score Percentile   Test of Premorbid Functioning: 100 89 23 Below Average        Memory:      NAB Memory Module, Form 1: Standard Score/ T Score Standard Score/ T Score Percentile   Total Memory Index --- 69 2 Well Below Average  List Learning        Total Trials 1-3 16/36 (35) 15/36 (33) 5 Well Below Average    List B 4/12 (48) 4/12 (48) 42 Average    Short Delay Free Recall 2/12 (25) 2/12 (25) 1 Exceptionally Low    Long Delay Free Recall 0/12 (24) 0/12 (24) <1 Exceptionally Low    Retention Percentage 0 (<19) 0 (15) <1 Exceptionally Low    Recognition Discriminability -3 (20) 1 (31) 3 Well Below Average  Shape Learning        Total Trials 1-3 10/27 (34) 14/27 (46) 34 Average    Delayed Recall 5/9 (47) 5/9 (47) 38 Average    Retention Percentage 125 (59) 71 (42) 21 Below Average    Recognition Discriminability 3 (32) 5 (42) 21 Below Average  Story Learning        Immediate Recall 35/80 (28) 47/80 (35) 7 Well Below Average    Delayed Recall 18/40 (30) 19/40 (32) 4 Well Below  Average    Retention Percentage 90 (50) 66 (38) 12 Below Average  Daily Living Memory        Immediate Recall 32/51 (35) 36/51 (39) 14 Below Average    Delayed Recall 5/17 (19) 8/17 (29) 2 Well Below Average    Retention Percentage 33 (13) 53 (27) 1 Exceptionally Low    Recognition Hits 6/10 (28) 5/10 (20) <1 Exceptionally Low        Attention/Executive Function:      Trail Making Test (TMT): Raw Score (T Score) Raw Score (T Score) Percentile     Part A 34 secs.,  1 error (52) 34 secs.,  0 errors (52) 58 Average    Part B 61 secs.,  1 error (66) 68 secs.,  1 error (62) 88 Above Average          Scaled Score Scaled Score Percentile   WAIS-IV Coding: 8 7 16  Below Average        NAB Attention Module, Form 1: T Score T Score Percentile     Digits Forward 43 48 42 Average    Digits Backwards 37 49 46 Average        D-KEFS Color-Word Interference Test: Raw Score (Scaled Score) Raw Score (Scaled Score) Percentile     Color Naming 37 secs. (8) 32 secs. (10) 50 Average    Word Reading  27 secs. (9) 27 secs. (9) 37 Average    Inhibition 66 secs. (11) 77 secs. (9) 37 Average      Total Errors 1 error (11) 2 errors (10) 50 Average    Inhibition/Switching 100 secs. (7) 82 secs. (9) 37 Average      Total Errors 3 errors (10) 3 errors (10) 50 Average        D-KEFS Verbal Fluency Test: Raw Score (Scaled Score) Raw Score (Scaled Score) Percentile     Letter Total Correct 30 (8) 34 (9) 37 Average    Category Total Correct 28 (7) 30 (8) 25 Average    Category Switching Total Correct 9 (6) 9 (6) 9 Below Average    Category Switching Accuracy 8 (7) 7 (6) 9 Below Average      Total Set Loss Errors 3 (9) 1 (11) 63 Average      Total Repetition Errors 1 (12) 5 (8) 25 Average        Language:      Verbal Fluency Test: Raw Score (T Score) Raw Score (T Score) Percentile     Phonemic Fluency (FAS) 30 (48) 34 (51) 54 Average    Animal Fluency 14 (47) 13 (43) 25 Average         NAB Language  Module, Form 1: T Score T Score Percentile     Auditory Comprehension 58 58 79 Above Average    Naming 30/31 (56) 30/31 (56) 73 Average        Visuospatial/Visuoconstruction:       Raw Score Raw Score Percentile   Clock Drawing: 10/10 8/10 --- Within Normal Limits        NAB Spatial Module, Form 1: T Score T Score Percentile     Figure Drawing Copy 48 66 95 Well Above Average         Scaled Score Scaled Score Percentile   WAIS-IV Block Design: 12 8 25  Average        Mood and Personality:       Raw Score Raw Score Percentile   Beck Depression Inventory - II: 9 4 --- Within Normal Limits  PROMIS Anxiety Questionnaire: 15 8 --- None to Slight        Additional Questionnaires:       Raw Score Raw Score Percentile   PROMIS Sleep Disturbance Questionnaire: 23 16 --- None to Slight   Informed Consent and Coding/Compliance:   The current evaluation represents a clinical evaluation for the purposes previously outlined by the referral source and is in no way reflective of a forensic evaluation.   Ashley Haynes was provided with a verbal description of the nature and purpose of the present neuropsychological evaluation. Also reviewed were the foreseeable risks and/or discomforts and benefits of the procedure, limits of confidentiality, and mandatory reporting requirements of this provider. The patient was given the opportunity to ask questions and receive answers about the evaluation. Oral consent to participate was provided by the patient.   This evaluation was conducted by Newman Nickels, Ph.D., ABPP-CN, board certified clinical neuropsychologist. Ms. Steward completed a clinical interview with Dr. Milbert Coulter, billed as one unit [phone], and 140 minutes of cognitive testing and scoring, billed as one unit [phone] and four additional units [id number]. Psychometrist Wallace Keller, B.S., assisted Dr. Milbert Coulter with test administration and scoring procedures. As a separate and discrete service, one unit [id number] and two  units [phone] were billed for Dr. Tammy Sours time spent in interpretation and report writing.

## 2022-07-05 ENCOUNTER — Ambulatory Visit (INDEPENDENT_AMBULATORY_CARE_PROVIDER_SITE_OTHER): Payer: No Typology Code available for payment source | Admitting: Family Medicine

## 2022-07-05 ENCOUNTER — Telehealth: Payer: Self-pay

## 2022-07-05 ENCOUNTER — Encounter: Payer: Self-pay | Admitting: Family Medicine

## 2022-07-05 ENCOUNTER — Encounter: Payer: Self-pay | Admitting: Psychology

## 2022-07-05 VITALS — BP 129/52 | HR 74 | Ht 60.0 in | Wt 153.0 lb

## 2022-07-05 DIAGNOSIS — Z1231 Encounter for screening mammogram for malignant neoplasm of breast: Secondary | ICD-10-CM | POA: Diagnosis not present

## 2022-07-05 DIAGNOSIS — R7303 Prediabetes: Secondary | ICD-10-CM

## 2022-07-05 DIAGNOSIS — I10 Essential (primary) hypertension: Secondary | ICD-10-CM | POA: Diagnosis not present

## 2022-07-05 DIAGNOSIS — E785 Hyperlipidemia, unspecified: Secondary | ICD-10-CM

## 2022-07-05 MED ORDER — ATORVASTATIN CALCIUM 80 MG PO TABS: 80.0000 mg | ORAL_TABLET | Freq: Every day | ORAL | 3 refills | Status: DC

## 2022-07-05 NOTE — Telephone Encounter (Signed)
-----   Message from Levert Feinstein, MD sent at 07/05/2022 11:24 AM EDT ----- Dr. Milbert Coulter:  Thank you for you feed back,   Will see her at clinic again.   Levert Feinstein  PLEASE SCHEDULE HER WITH ME AT NEXT AVAILABLE  ----- Message ----- From: Rosann Auerbach, PhD Sent: 07/05/2022  11:12 AM EDT To: Agapito Games, MD; Levert Feinstein, MD  LP or FDG-PET could be useful if she is up for that given her age and general stability. Action-based tremors are quite pronounced. DaTscan or alpha-synuclein skin biopsy could also be useful. No other major PD concerns tho, might be ET?

## 2022-07-05 NOTE — Assessment & Plan Note (Signed)
Not at goal.  Please draw cholesterol in about 2 months. Please fast. Can come here for labwork.   You can take 2 of your current cholesterol pill until you run out.  Then pick up the 80mg  dose.

## 2022-07-05 NOTE — Addendum Note (Signed)
Addended by: Deno Etienne on: 07/05/2022 09:16 AM   Modules accepted: Orders

## 2022-07-05 NOTE — Assessment & Plan Note (Signed)
Due for A1C today.  Continue to work on healthy diet and regular exercise.

## 2022-07-05 NOTE — Telephone Encounter (Signed)
Called pt, Scheduled her with Dr. Terrace Arabia on 7/2 @ 11 am.

## 2022-07-05 NOTE — Progress Notes (Signed)
   Established Patient Office Visit  Subjective   Patient ID: Ashley Haynes, female    DOB: [birth year]  Age: 67 y.o. MRN: [id number]  No chief complaint on file.   HPI  Impaired fasting glucose-no increased thirst or urination. No symptoms consistent with hypoglycemia.  Hypertension- Pt denies chest pain, SOB, dizziness, or heart palpitations.  Taking meds as directed w/o problems.  Denies medication side effects.      ROS    Objective:     BP (!) 129/52   Pulse 74   Ht 5' (1.524 m)   Wt 153 lb (69.4 kg)   SpO2 99%   BMI 29.88 kg/m    Physical Exam Vitals and nursing note reviewed.  Constitutional:      Appearance: She is well-developed.  HENT:     Head: Normocephalic and atraumatic.  Cardiovascular:     Rate and Rhythm: Normal rate and regular rhythm.     Heart sounds: Normal heart sounds.  Pulmonary:     Effort: Pulmonary effort is normal.     Breath sounds: Normal breath sounds.  Skin:    General: Skin is warm and dry.  Neurological:     Mental Status: She is alert and oriented to person, place, and time.  Psychiatric:        Behavior: Behavior normal.      No results found for any visits on 07/05/22.    The 10-year ASCVD risk score (Arnett DK, et al., 2019) is: 10.7%    Assessment & Plan:   Problem List Items Addressed This Visit       Cardiovascular and Mediastinum   Essential hypertension, benign    Looks great with the addition of the hct. Will check BMP today.       Relevant Medications   atorvastatin (LIPITOR) 80 MG tablet   Other Relevant Orders   HgB A1c   BASIC METABOLIC PANEL WITH GFR     Other   Prediabetes - Primary    Due for A1C today.  Continue to work on healthy diet and regular exercise.       Relevant Orders   HgB A1c   BASIC METABOLIC PANEL WITH GFR   Hyperlipidemia    Not at goal.  Please draw cholesterol in about 2 months. Please fast. Can come here for labwork.   You can take 2 of your current cholesterol  pill until you run out.  Then pick up the 80mg  dose.       Relevant Medications   atorvastatin (LIPITOR) 80 MG tablet    Return in about 4 months (around 11/04/2022) for Hypertension.    Nani Gasser, MD

## 2022-07-05 NOTE — Patient Instructions (Addendum)
Please draw cholesterol in about 2 months. Please fast. Can come here for labwork.   You can take 2 of your current cholesterol pill until you run out.  Then pick up the 80mg  dose.

## 2022-07-05 NOTE — Telephone Encounter (Signed)
Routing to phone room to schedule at Dr. Zannie Cove next available slot

## 2022-07-05 NOTE — Assessment & Plan Note (Signed)
Looks great with the addition of the hct. Will check BMP today.

## 2022-07-06 LAB — BASIC METABOLIC PANEL WITH GFR
BUN: 20 mg/dL (ref 7–25)
CO2: 30 mmol/L (ref 20–32)
Calcium: 10.4 mg/dL (ref 8.6–10.4)
Chloride: 98 mmol/L (ref 98–110)
Creat: 0.86 mg/dL (ref 0.50–1.05)
Glucose, Bld: 106 mg/dL — ABNORMAL HIGH (ref 65–99)
Potassium: 4.8 mmol/L (ref 3.5–5.3)
Sodium: 138 mmol/L (ref 135–146)
eGFR: 74 mL/min/{1.73_m2} (ref >=60)

## 2022-07-06 LAB — HEMOGLOBIN A1C
Hgb A1c MFr Bld: 6.1 % of total Hgb — ABNORMAL HIGH (ref <5.7)
Mean Plasma Glucose: 128 mg/dL
eAG (mmol/L): 7.1 mmol/L

## 2022-07-06 NOTE — Progress Notes (Signed)
Hi Ashley Haynes, your A1C is 6.1, which is up from previous around 5.9.  It still technically in the prediabetes range but it did go up to just make sure you are continue to work on healthy food choices, low-carb diet, and regular exercise.  Metabolic panel looks great.

## 2022-07-10 ENCOUNTER — Ambulatory Visit: Payer: No Typology Code available for payment source | Admitting: Neurology

## 2022-07-11 ENCOUNTER — Ambulatory Visit (INDEPENDENT_AMBULATORY_CARE_PROVIDER_SITE_OTHER): Payer: No Typology Code available for payment source | Admitting: Psychology

## 2022-07-11 DIAGNOSIS — G3184 Mild cognitive impairment, so stated: Secondary | ICD-10-CM

## 2022-07-11 NOTE — Progress Notes (Signed)
   Neuropsychology Feedback Session Eligha Bridegroom. [facility] [place] Department of Neurology  Reason for Referral:   NOVALY LOOKABAUGH is a 67 y.o. right-handed African-American female referred by  Nani Gasser, M.D. , to characterize her current cognitive functioning and assist with diagnostic clarity and treatment planning in the context of a prior mild neurocognitive disorder diagnosis of uncertain etiology and concern for progressive cognitive decline.   Feedback:   Ms. Tamburro completed a comprehensive neuropsychological evaluation on 07/04/2022. Please refer to that encounter for the full report and recommendations. Briefly, results suggested an isolated impairment surrounding all aspects of verbal learning and memory. Performances were appropriate relative to age-matched peers across all other assessed cognitive domains. Relative to her previous evaluation in March 2022, there was general stability across all assessed cognitive domains. Subtle decline could be observed across retention rates for story and shape-based learning tasks (90% - 125% in 2022 versus 66% - 71% currently), as well as a slightly increased repetition rate across a semantic fluency set shifting task. However, as a whole, similar patterns were present across both evaluations. Slight improvements could be argued across complex attention and her drawing of a complex figure. The etiology of isolated verbal memory impairment continues to be unclear. Verbal memory impairment can elevate concerns surrounding an underlying neurodegenerative illness such as Alzheimer's disease. Ms. Forst age is young relative to when this illness would normally become symptomatic and performances across visual memory and other non-memory areas commonly implicated early on with this illness were appropriate across current testing. This, coupled with relative stability over the past two years, is encouraging. While this illness in extremely  early stages cannot be ruled out, it certainly should not be ruled in with any degree of confidence currently.   Ms. Wiesner was accompanied by her husband during the current feedback session. Content of the current session focused on the results of her neuropsychological evaluation. Ms. Neubeck was given the opportunity to ask questions and her questions were answered. She was encouraged to reach out should additional questions arise. A copy of her report was provided at the conclusion of the visit.      One unit [phone] was billed for Dr. Tammy Sours time spent preparing for, conducting, and documenting the current feedback session with Ms. Montez Morita.

## 2022-07-25 ENCOUNTER — Ambulatory Visit: Payer: No Typology Code available for payment source

## 2022-07-25 DIAGNOSIS — Z1231 Encounter for screening mammogram for malignant neoplasm of breast: Secondary | ICD-10-CM | POA: Diagnosis not present

## 2022-07-26 NOTE — Progress Notes (Signed)
Hi Ashley Haynes, this all questionable area on your left breast so the imaging department will be contacting you for diagnostic mammogram.

## 2022-07-27 ENCOUNTER — Other Ambulatory Visit: Payer: Self-pay | Admitting: Family Medicine

## 2022-07-27 DIAGNOSIS — R928 Other abnormal and inconclusive findings on diagnostic imaging of breast: Secondary | ICD-10-CM

## 2022-08-03 ENCOUNTER — Ambulatory Visit
Admission: RE | Admit: 2022-08-03 | Discharge: 2022-08-03 | Disposition: A | Discharge status: Home / Self Care | Payer: No Typology Code available for payment source | Source: Ambulatory Visit | Attending: Family Medicine | Admitting: Family Medicine

## 2022-08-03 ENCOUNTER — Ambulatory Visit: Payer: No Typology Code available for payment source

## 2022-08-03 DIAGNOSIS — R928 Other abnormal and inconclusive findings on diagnostic imaging of breast: Secondary | ICD-10-CM | POA: Diagnosis not present

## 2022-08-03 NOTE — Progress Notes (Signed)
Please call patient. Normal mammogram.  Repeat in 1 year.  

## 2022-09-04 ENCOUNTER — Encounter: Payer: Self-pay | Admitting: Neurology

## 2022-09-04 ENCOUNTER — Ambulatory Visit (INDEPENDENT_AMBULATORY_CARE_PROVIDER_SITE_OTHER): Payer: No Typology Code available for payment source | Admitting: Neurology

## 2022-09-04 VITALS — BP 136/65 | HR 79 | Ht 59.0 in | Wt 146.5 lb

## 2022-09-04 DIAGNOSIS — G309 Alzheimer's disease, unspecified: Secondary | ICD-10-CM | POA: Insufficient documentation

## 2022-09-04 DIAGNOSIS — F03A0 Unspecified dementia, mild, without behavioral disturbance, psychotic disturbance, mood disturbance, and anxiety: Secondary | ICD-10-CM | POA: Insufficient documentation

## 2022-09-04 NOTE — Progress Notes (Signed)
Chief Complaint  Patient presents with   Memory Loss    Rm15, husband present Floydene Flock) Memory loss: MOCA was 51      ASSESSMENT AND PLAN  LEVEE GARROD is a 67 y.o. female   Mild cognitive impairment  MoCA examination 24/30, mostly short-term memory loss, most consistent with early stage of central nervous system degenerative disorder/Alzheimer's disease  Laboratory evaluation showed no treatable etiology   MRI of the brain showed no significant abnormalities  Emphasized importance of healthy lifestyle,  Could not tolerate Aricept,  cause bad dreams,   Namenda 10 mg twice a day no benefit,  Desire further evaluation and potential treatment if possible, after discussed with patient and her husband, we will proceed with amyloid scan, neurofilament light chain, beta amyloid 42/40 ratio, glial fibrilliary protein  DIAGNOSTIC DATA (LABS, IMAGING, TESTING) - I reviewed patient records, labs, notes, testing and imaging myself where available.   MEDICAL HISTORY:  PAYTIENCE STRITTMATTER, seen in request by   BILLEE MACKER is a 67 year old female, seen in request by her primary care physician Dr. Sunnie Nielsen, for evaluation of memory loss, right hand tremor, initial evaluation was on August 18, 2019   I reviewed and summarized the referring note.  Past medical history Hypertension,   She retired from Multimedia programmer job in 2019, denies difficulty handling her job, has become less active since retirement, she reported sometimes she walked down the hallway, forgot why she was there, after a while, it will come back to her, sometimes she started thought, difficult to carry through.  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, labs tolerated pain, sleeps well has good appetite,  MoCA examination 20/30  She tried Aricept, caused trouble sleeping has discontinued it,  Personally reviewed MRI of the brain, mild atrophy, no acute abnormality  UPDATE August 27th 2024: She is with her husband today visit, she retired from her part-time job, overall has mild worsening memory loss especially short-term memory loss, ambulate without difficulty but tends to be sedentary, enjoying reading and paint, sleeps well, have good appetite, no agitations  MoCA examination today is 23/30, She is no longer on Aricpet, cause bad dreams, namenda, did not do anything.  Husband and patient wants to explore the potential new treatment including IV Leqembi, desire further test to confirm her diagnosis of Alzheimer's disease,  PHYSICAL EXAM:   Vitals:   09/04/22 1245  BP: 136/65  Pulse: 79  Weight: 146 lb 8 oz (66.5 kg)  Height: 4\' 11"  (1.499 m)   Body mass index is 29.59 kg/m.  PHYSICAL EXAMNIATION:  Gen: NAD, conversant, well nourised, well groomed                     Cardiovascular: Regular rate  rhythm, no peripheral edema, warm, nontender. Eyes: Conjunctivae clear without exudates or hemorrhage Neck: Supple, no carotid bruits. Pulmonary: Clear to auscultation bilaterally   NEUROLOGICAL EXAM:  MENTAL STATUS: Speech/cognition: Awake, alert, oriented to history taking and casual conversation     09/04/2022   12:47 PM 08/01/2021   11:38 AM 08/14/2019    4:30 PM  Montreal Cognitive  Assessment   Visuospatial/ Executive (0/5) 5 5 5   Naming (0/3) 3 3 3   Attention: Read list of digits (0/2) 2 2 2   Attention: Read list of letters (0/1) 1 1 1   Attention: Serial 7 subtraction starting at 100 (0/3) 3 3 2   Language: Repeat phrase (0/2) 2 1 2   Language : Fluency (0/1) 1 1 0  Abstraction (0/2) 2 2 2   Delayed Recall (0/5) 0 1 1  Orientation (0/6) 4 5 5   Total 23 24 23   Adjusted Score (based on education) 23 24 23     CRANIAL NERVES: CN II: Visual fields are full to confrontation. Pupils are round equal and briskly reactive to light. CN III, IV, VI: extraocular movement are normal. No ptosis. CN V: Facial sensation is intact to light touch CN VII: Face is symmetric with normal eye closure  CN VIII: Hearing is normal to causal conversation. CN IX, X: Phonation is normal. CN XI: Head turning and shoulder shrug are intact  MOTOR: There is no pronator drift of out-stretched arms. Muscle bulk and tone are normal. Muscle strength is normal.  REFLEXES: Reflexes are 2+ and symmetric at the biceps, triceps, knees, and ankles. Plantar responses are flexor.  SENSORY: Intact to light touch, pinprick   COORDINATION: There is no trunk or limb dysmetria noted.  GAIT/STANCE: Posture is normal. Gait is steady   REVIEW OF SYSTEMS:  Full 14 system review of systems performed and notable only for as above All other review of systems were negative.   ALLERGIES: No Known Allergies  HOME MEDICATIONS: Current Outpatient Medications  Medication Sig Dispense Refill   atorvastatin (LIPITOR) 80 MG tablet Take 1 tablet (80 mg total) by mouth at bedtime. 90 tablet 3   CALCIUM PO Take by mouth.     Flaxseed, Linseed, (FLAX SEEDS PO) Take by mouth.     GARLIC PO Take by mouth.     latanoprost (XALATAN) 0.005 % ophthalmic solution Place 1 drop into both eyes at bedtime.     lisinopril-hydrochlorothiazide (ZESTORETIC) 20-12.5 MG tablet TAKE 1 TABLET BY MOUTH EVERY DAY 90 tablet 0    MAGNESIUM PO Take by mouth.     Multiple Vitamin (MULTIVITAMIN) tablet Take 1 tablet by mouth daily.     Omega-3 Fatty Acids (FISH OIL PO) Take by mouth.     No current facility-administered medications for this visit.    PAST MEDICAL HISTORY: Past Medical History:  Diagnosis Date   Allergic reaction 02/21/2022   Anemia, unspecified 04/29/2006   Benign neoplasm of skin 04/25/2007   Cardiac risk counseling 03/11/2015   Ten-year ASCVD risk 15.1% (calculated 03/11/2015) given hyperlipidemia, moderately well-controlled blood pressure, age. Prediabetes is also a concern, as well as increased BMI   Chronic right hip pain 08/18/2019   Essential hypertension, benign 08/17/2010   Former smoker    Quit 09/1988   Hyperlipidemia    Mild cognitive impairment with memory loss 04/07/2020   Prediabetes 03/08/2015   Symptomatic menopausal or female climacteric states 04/29/2006   Centricity Description: HOT FLASHES    PAST SURGICAL HISTORY: Past Surgical History:  Procedure Laterality Date  CESAREAN SECTION     COLONOSCOPY  07/04/2006   lt[facility]      FAMILY HISTORY: Family History  Problem Relation Age of Onset   Heart disease Mother 48       MI   Diabetes Mother    Glaucoma Mother    Hyperlipidemia Sister    Colon cancer Neg Hx    Colon polyps Neg Hx    Esophageal cancer Neg Hx    Rectal cancer Neg Hx    Stomach cancer Neg Hx     SOCIAL HISTORY: Social History   Socioeconomic History   Marital status: Married    Spouse name: Not on file   Number of children: Not on file   Years of education: 15   Highest education level: Some college, no degree  Occupational History   Occupation: Retired  Tobacco Use   Smoking status: Former    Current packs/day: 0.00    Types: Cigarettes    Quit date: 1990    Years since quitting: 34.6   Smokeless tobacco: Never  Vaping Use   Vaping status: Never Used  Substance and Sexual Activity   Alcohol use: Yes    Comment: occasionally    Drug use: No   Sexual activity: Not Currently  Other Topics Concern   Not on file  Social History Narrative   Not on file   Social Determinants of Health   Financial Resource Strain: Not on file  Food Insecurity: Not on file  Transportation Needs: Not on file  Physical Activity: Not on file  Stress: Not on file  Social Connections: Not on file  Intimate Partner Violence: Not on file      Levert Feinstein, M.D. Ph.D.  [facility] [address] [place], [place] [address] Ph: [phone] Fax: [phone]  CC:  Agapito Games, MD [address] [place],  [place] [address]  Agapito Games, MD

## 2022-09-06 LAB — BETA AMYLOID 42/40 RATIO, PLASMA
Beta-amyloid 40: 198.22 pg/mL
Beta-amyloid 42/40/Ratio: 0.092 — ABNORMAL LOW (ref >0.102)
Beta-amyloid 42: 18.21 pg/mL

## 2022-09-06 LAB — NEUROFILAMENT LIGHT CHAIN: Neurofilament Light Chain: 2.97 pg/mL (ref 0.00–4.61)

## 2022-09-06 LAB — GLIAL FIBRILLARY ACID PROTEIN: Glial Fibrillary Acid Protein: 116 pg/mL (ref 0.0–186.0)

## 2022-09-13 ENCOUNTER — Telehealth: Payer: Self-pay | Admitting: Neurology

## 2022-09-13 NOTE — Telephone Encounter (Signed)
Ashley Haynes #[id number] exp. 09/13/22-11/13/22 sent to [facility] [phone]

## 2022-09-27 ENCOUNTER — Encounter (HOSPITAL_COMMUNITY)
Admission: RE | Admit: 2022-09-27 | Discharge: 2022-09-27 | Disposition: A | Discharge status: Home / Self Care | Payer: No Typology Code available for payment source | Source: Ambulatory Visit | Attending: Neurology | Admitting: Neurology

## 2022-09-27 ENCOUNTER — Other Ambulatory Visit: Payer: Self-pay | Admitting: Family Medicine

## 2022-09-27 DIAGNOSIS — F0390 Unspecified dementia without behavioral disturbance: Secondary | ICD-10-CM | POA: Diagnosis not present

## 2022-09-27 DIAGNOSIS — I10 Essential (primary) hypertension: Secondary | ICD-10-CM

## 2022-09-27 DIAGNOSIS — G309 Alzheimer's disease, unspecified: Secondary | ICD-10-CM | POA: Diagnosis not present

## 2022-09-27 MED ORDER — FLORBETAPIR F 18 500-1900 MBQ/ML IV SOLN
9.1000 | Freq: Once | INTRAVENOUS | Status: AC
  Administered 2022-09-27: 9.7 via INTRAVENOUS

## 2022-10-26 ENCOUNTER — Telehealth: Payer: Self-pay | Admitting: Neurology

## 2022-10-26 NOTE — Telephone Encounter (Signed)
Pt. Husband called wanting to know when his wife will be getting her PET Scan results and lab work results that were done about a month ago. Please advise.

## 2022-10-29 NOTE — Telephone Encounter (Signed)
Please call patient and let her know, I have sending the report through MyChart in September   PET scan of the brain did show evidence of moderate to frequent amyloid plaque, which support a diagnosis of Alzheimer's disease.  Laboratory evaluations also showed  Laboratory evaluation showed:   --- Decreased the beta amyloid 42/40 ratio, 2.092, the ratio less than or equal to 0.10 to suggest a high probability of Alzheimer's disease.   Her value was 0.092, above findings along with her clinical history, slow decline abnormal PET Scan  consistent with the diagnosis of Alzheimer's disease.   --- Rest of the laboratory evaluation such as neurofilament light chain, glial fibrillary acid protein were within normal limit.

## 2022-11-05 ENCOUNTER — Ambulatory Visit: Payer: No Typology Code available for payment source | Admitting: Family Medicine

## 2022-11-06 ENCOUNTER — Telehealth: Payer: Self-pay | Admitting: Neurology

## 2022-11-06 DIAGNOSIS — S0990XA Unspecified injury of head, initial encounter: Secondary | ICD-10-CM | POA: Diagnosis not present

## 2022-11-06 DIAGNOSIS — S069X9A Unspecified intracranial injury with loss of consciousness of unspecified duration, initial encounter: Secondary | ICD-10-CM | POA: Diagnosis not present

## 2022-11-06 DIAGNOSIS — R569 Unspecified convulsions: Secondary | ICD-10-CM

## 2022-11-06 DIAGNOSIS — S0101XA Laceration without foreign body of scalp, initial encounter: Secondary | ICD-10-CM | POA: Diagnosis not present

## 2022-11-06 DIAGNOSIS — R55 Syncope and collapse: Secondary | ICD-10-CM | POA: Diagnosis not present

## 2022-11-06 DIAGNOSIS — W19XXXA Unspecified fall, initial encounter: Secondary | ICD-10-CM | POA: Insufficient documentation

## 2022-11-06 DIAGNOSIS — R9431 Abnormal electrocardiogram [ECG] [EKG]: Secondary | ICD-10-CM | POA: Diagnosis not present

## 2022-11-06 DIAGNOSIS — M542 Cervicalgia: Secondary | ICD-10-CM | POA: Diagnosis not present

## 2022-11-06 HISTORY — DX: Unspecified fall, initial encounter: W19.XXXA

## 2022-11-06 HISTORY — DX: Unspecified convulsions: R56.9

## 2022-11-06 NOTE — Telephone Encounter (Signed)
Pt's husband states he heard a loud sound and found pt unconscious in the floor at the foot of the bed.  When she came around she was staring off and making noises and not making sense. Got her on her feet and she collapse again and passed out again and took her to the ED after she came to. ED suggested we follow up with Dr. Terrace Arabia due to could not fine any cause for her passing out.. Pt has an appointment with PCP on Thursday morning. Would like a call from the nurse.  Also have not heard about the results for her bloodwork

## 2022-11-06 NOTE — Telephone Encounter (Signed)
Scheduled for follow up tomorrow at 10am

## 2022-11-06 NOTE — Telephone Encounter (Signed)
Called and spoke to husband on [place]. He reports no life changing events, no changes in actions prior to episode. He reports hearing a sound, finding her in the floor, she kind of came to but could not respond to stimulation, she then went into a state where her eyes were fixed, she was moaning/making noise and laying still. Husband reports no convulsions, foaming at the mouth or loss of continence. He reports this happened twice in a 5-8 minute stretch and then he was able to get her in the car and take her to the ER, where evaluation was completed and they were instructed to follow up with our office. He states she is just taking her BP medication daily that has not changed and no other episodes since the ER visit, she did have a laceration on her head that required one stitch but nothing further. They have a PCP visit tomorrow and next scheduled with [place] February 2025.

## 2022-11-06 NOTE — Telephone Encounter (Signed)
Please call patient, the semiology is suggestive of seizure  I ordered EEG Put her on schedule with me Tomorrow Oct 30th, ok to overlap with injection slot

## 2022-11-07 ENCOUNTER — Ambulatory Visit (INDEPENDENT_AMBULATORY_CARE_PROVIDER_SITE_OTHER): Payer: No Typology Code available for payment source | Admitting: Neurology

## 2022-11-07 ENCOUNTER — Encounter: Payer: Self-pay | Admitting: Neurology

## 2022-11-07 VITALS — BP 117/58 | HR 87 | Ht 59.0 in | Wt 141.4 lb

## 2022-11-07 DIAGNOSIS — G40909 Epilepsy, unspecified, not intractable, without status epilepticus: Secondary | ICD-10-CM | POA: Diagnosis not present

## 2022-11-07 DIAGNOSIS — G309 Alzheimer's disease, unspecified: Secondary | ICD-10-CM | POA: Diagnosis not present

## 2022-11-07 MED ORDER — DIVALPROEX SODIUM ER 500 MG PO TB24
500.0000 mg | ORAL_TABLET | Freq: Every evening | ORAL | 11 refills | Status: DC
Start: 2022-11-07 — End: 2023-07-25

## 2022-11-07 NOTE — Progress Notes (Signed)
Chief Complaint  Patient presents with   Hospitalization Follow-up    Rm16, husband present, Hospital f/u: semiology is suggestive of seizure, husband found her in the floor and she went to er         ASSESSMENT AND PLAN  ADDELYNNE Haynes is a 67 y.o. female   Alzheimer's disease  Confirmed by slow progressive short-term memory loss, neuropsychology evaluation,  Also by positive brain amyloid DaTSCAN, plasma beta amyloid 42/40 ratio 0.092 New onset seizure on November 06, 2022  MRI of the brain with without contrast  EEG  Depakote ER 500 mg every night  Return To Clinic With NP In 3 Months   DIAGNOSTIC DATA (LABS, IMAGING, TESTING) - I reviewed patient records, labs, notes, testing and imaging myself where available.   MEDICAL HISTORY:  Ashley Haynes, seen in request by   GEORGENIA Haynes is a 67 year old female, seen in request by her primary care physician Dr. Sunnie Nielsen, for evaluation of memory loss, right hand tremor, initial evaluation was on August 18, 2019   I reviewed and summarized the referring note.  Past medical history Hypertension,   She retired from Multimedia programmer job in 2019, denies difficulty handling her job, has become less active since retirement, she reported sometimes she walked down the hallway, forgot why she was there, after a while, it will come back to her, sometimes she started thought, difficult to carry through.  She denies difficulty handling daily activity, still driving, pickup painting as her new hobby, sleeps well most of the time, but often woke up multiple times at night to use bathroom, sometimes difficulty falling back to sleep,   Father is at age 69, still sharp minded, does a lot of community work, keep up his plumbing license, mother died of diabetes and heart disease   Personally reviewed MRI of the brain March 2021 that was normal Laboratory evaluation February 2021: A1c 5.9, no significant abnormality on CBC, hemoglobin  15, CMP, LDL was elevated 143, normal TSH 1.3  EEG was normal.   She has slow decline in functional status over the past few years, MoCA examination 20/06 August 2021, 31 August 2022 Had a formal neuropsychology evaluation by Dr. Vanessa Kick in June 2024, suggested impairment surrounding all aspects of the verbal learning and memory, she was given the diagnosis of mild cognitive disorder  Per family's request, to clarify her diagnosis, she had amyloid PET scan September 2024, which was positive, consistent with presence of moderate to frequent neurotic beta amyloid plaque in the brain,  Laboratory evaluation showed beta amyloid 42:40 ratio was decreased 0.092, highly probability of Alzheimer's disease Neurofilament light chain was within normal limit 2.97, glial fibrillary acid protein 116  UPDATE Nov 07 2022: Today's urgent visit following her ER presentation November 06, 2022, husband heard a loud thump waking him up 5 AM, found patient on the floor unconscious facedown, he was able to help her up, she was incoherent, then had a sudden body stiff, staring spells, making grunting sound, lasting for few minutes, then gradually become responsive again, total episode lasted about 10 to 15 minutes  Patient has no clear recollection of the events Emergency room labs showed negative troponin, CMP, CBC   PHYSICAL EXAM:   Vitals:   11/07/22 0959  BP: (!) 117/58  Pulse: 87  Weight: 141 lb 6.4 oz (64.1 kg)  Height: 4\' 11"  (1.499 m)   Body mass index is 28.56 kg/m.  PHYSICAL EXAMNIATION:  Gen: NAD,  conversant, well nourised, well groomed                     Cardiovascular: Regular rate rhythm, no peripheral edema, warm, nontender. Eyes: Conjunctivae clear without exudates or hemorrhage Neck: Supple, no carotid bruits. Pulmonary: Clear to auscultation bilaterally   NEUROLOGICAL EXAM:  MENTAL STATUS: Speech/cognition: Awake, alert, oriented to history taking and casual  conversation     09/04/2022   12:47 PM 08/01/2021   11:38 AM 08/14/2019    4:30 PM  Montreal Cognitive Assessment   Visuospatial/ Executive (0/5) 5 5 5   Naming (0/3) 3 3 3   Attention: Read list of digits (0/2) 2 2 2   Attention: Read list of letters (0/1) 1 1 1   Attention: Serial 7 subtraction starting at 100 (0/3) 3 3 2   Language: Repeat phrase (0/2) 2 1 2   Language : Fluency (0/1) 1 1 0  Abstraction (0/2) 2 2 2   Delayed Recall (0/5) 0 1 1  Orientation (0/6) 4 5 5   Total 23 24 23   Adjusted Score (based on education) 23 24 23     CRANIAL NERVES: CN II: Visual fields are full to confrontation. Pupils are round equal and briskly reactive to light. CN III, IV, VI: extraocular movement are normal. No ptosis. CN V: Facial sensation is intact to light touch CN VII: Face is symmetric with normal eye closure  CN VIII: Hearing is normal to causal conversation. CN IX, X: Phonation is normal. CN XI: Head turning and shoulder shrug are intact  MOTOR: There is no pronator drift of out-stretched arms. Muscle bulk and tone are normal. Muscle strength is normal.  REFLEXES: Reflexes are 2+ and symmetric at the biceps, triceps, knees, and ankles. Plantar responses are flexor.  SENSORY: Intact to light touch, pinprick   COORDINATION: There is no trunk or limb dysmetria noted.  GAIT/STANCE: Posture is normal. Gait is steady   REVIEW OF SYSTEMS:  Full 14 system review of systems performed and notable only for as above All other review of systems were negative.   ALLERGIES: No Known Allergies  HOME MEDICATIONS: Current Outpatient Medications  Medication Sig Dispense Refill   atorvastatin (LIPITOR) 80 MG tablet Take 1 tablet (80 mg total) by mouth at bedtime. 90 tablet 3   CALCIUM PO Take by mouth.     Flaxseed, Linseed, (FLAX SEEDS PO) Take by mouth.     GARLIC PO Take by mouth.     latanoprost (XALATAN) 0.005 % ophthalmic solution Place 1 drop into both eyes at bedtime.      lisinopril-hydrochlorothiazide (ZESTORETIC) 20-12.5 MG tablet TAKE 1 TABLET BY MOUTH EVERY DAY 90 tablet 0   MAGNESIUM PO Take by mouth.     Multiple Vitamin (MULTIVITAMIN) tablet Take 1 tablet by mouth daily.     Omega-3 Fatty Acids (FISH OIL PO) Take by mouth.     No current facility-administered medications for this visit.    PAST MEDICAL HISTORY: Past Medical History:  Diagnosis Date   Allergic reaction 02/21/2022   Anemia, unspecified 04/29/2006   Benign neoplasm of skin 04/25/2007   Cardiac risk counseling 03/11/2015   Ten-year ASCVD risk 15.1% (calculated 03/11/2015) given hyperlipidemia, moderately well-controlled blood pressure, age. Prediabetes is also a concern, as well as increased BMI   Chronic right hip pain 08/18/2019   Essential hypertension, benign 08/17/2010   Former smoker    Quit 09/1988   Hyperlipidemia    Mild cognitive impairment with memory loss 04/07/2020   Prediabetes 03/08/2015  Symptomatic menopausal or female climacteric states 04/29/2006   Centricity Description: HOT FLASHES    PAST SURGICAL HISTORY: Past Surgical History:  Procedure Laterality Date   CESAREAN SECTION     COLONOSCOPY  07/04/2006   [facility]      FAMILY HISTORY: Family History  Problem Relation Age of Onset   Heart disease Mother 52       MI   Diabetes Mother    Glaucoma Mother    Hyperlipidemia Sister    Colon cancer Neg Hx    Colon polyps Neg Hx    Esophageal cancer Neg Hx    Rectal cancer Neg Hx    Stomach cancer Neg Hx     SOCIAL HISTORY: Social History   Socioeconomic History   Marital status: Married    Spouse name: Not on file   Number of children: Not on file   Years of education: 15   Highest education level: Some college, no degree  Occupational History   Occupation: Retired  Tobacco Use   Smoking status: Former    Current packs/day: 0.00    Types: Cigarettes    Quit date: 1990    Years since quitting: 34.8   Smokeless tobacco: Never  Vaping Use    Vaping status: Never Used  Substance and Sexual Activity   Alcohol use: Yes    Comment: occasionally   Drug use: No   Sexual activity: Not Currently  Other Topics Concern   Not on file  Social History Narrative   Not on file   Social Determinants of Health   Financial Resource Strain: Not on file  Food Insecurity: Not on file  Transportation Needs: Not on file  Physical Activity: Not on file  Stress: Not on file  Social Connections: Unknown (11/06/2022)   Received from [facility]   Social Network    Social Network: Not on file  Intimate Partner Violence: Not At Risk (11/06/2022)   Received from [facility]   HITS    Over the last 12 months how often did your partner physically hurt you?: 1    Over the last 12 months how often did your partner insult you or talk down to you?: 1    Over the last 12 months how often did your partner threaten you with physical harm?: 1    Over the last 12 months how often did your partner scream or curse at you?: 1      Levert Feinstein, M.D. Ph.D.  [facility] [address], [address] [place], [place] [address] Ph: [phone] Fax: [phone]  CC:  Agapito Games, MD [address] [place],  [place] [address]  Agapito Games, MD

## 2022-11-08 ENCOUNTER — Ambulatory Visit (INDEPENDENT_AMBULATORY_CARE_PROVIDER_SITE_OTHER): Payer: No Typology Code available for payment source | Admitting: Family Medicine

## 2022-11-08 ENCOUNTER — Encounter: Payer: Self-pay | Admitting: Family Medicine

## 2022-11-08 VITALS — BP 117/46 | HR 72 | Ht 59.0 in | Wt 141.0 lb

## 2022-11-08 DIAGNOSIS — I10 Essential (primary) hypertension: Secondary | ICD-10-CM

## 2022-11-08 DIAGNOSIS — G309 Alzheimer's disease, unspecified: Secondary | ICD-10-CM

## 2022-11-08 DIAGNOSIS — R7301 Impaired fasting glucose: Secondary | ICD-10-CM | POA: Diagnosis not present

## 2022-11-08 DIAGNOSIS — S0990XA Unspecified injury of head, initial encounter: Secondary | ICD-10-CM

## 2022-11-08 DIAGNOSIS — G40909 Epilepsy, unspecified, not intractable, without status epilepticus: Secondary | ICD-10-CM

## 2022-11-08 NOTE — Progress Notes (Signed)
Established Patient Office Visit  Subjective   Patient ID: Ashley Haynes, female    DOB: 18-Sep-1955  Age: 67 y.o. MRN: [id number]  Chief Complaint  Patient presents with   Hypertension    HPI She was also seen in the emergency department on 1029.  Her husband said that he heard a thud that woke him up he got up and saw his wife was missing from the bed.  He got out of bed to look for and realized that she was in the floor at the end of the bed.  She look like she had passed out he tried to help her sit up and then she passed out again.  Let her lay there for a while and then try to get her up again and she had a moment where she just stared straight ahead and started making a gurgling noise and then almost like she woke up and came to.  He says he got her dressed and immediately went to the emergency department.  She did have a follow-up with neurology yesterday and they have started Depakote for possible seizure disorder.  They have also formally diagnosed her with Alzheimer's disease she does some additional testing and EEG coming up in December.  Hypertension- Pt denies chest pain, SOB, dizziness, or heart palpitations.  Taking meds as directed w/o problems.  Denies medication side effects.    She does have 1 staple on the left scalp that will need to be removed 10 days after the initial placement.    ROS    Objective:     BP (!) 117/46   Pulse 72   Ht 4\' 11"  (1.499 m)   Wt 141 lb (64 kg)   SpO2 100%   BMI 28.48 kg/m    Physical Exam Vitals and nursing note reviewed.  Constitutional:      Appearance: Normal appearance.  HENT:     Head: Normocephalic and atraumatic.  Eyes:     Conjunctiva/sclera: Conjunctivae normal.  Cardiovascular:     Rate and Rhythm: Normal rate and regular rhythm.  Pulmonary:     Effort: Pulmonary effort is normal.     Breath sounds: Normal breath sounds.  Skin:    General: Skin is warm and dry.  Neurological:     Mental Status: She is  alert.  Psychiatric:        Mood and Affect: Mood normal.      No results found for any visits on 11/08/22.    The 10-year ASCVD risk score (Arnett DK, et al., 2019) is: 8.7%    Assessment & Plan:   Problem List Items Addressed This Visit       Cardiovascular and Mediastinum   Essential hypertension, benign - Primary    Her husband was worried that maybe her blood pressure could have dropped low in the middle the night which I think is absolutely reasonable to evaluate.  Recommend checking blood pressure a few times in the morning in the evenings.  He is she is gena come back in about a week for staple removal on her scalp.  So we can take a look with his blood pressures at that time and make an adjustment to her regimen if needed.  Stay well-hydrated.        Endocrine   IFG (impaired fasting glucose)    Plan to recheck A1C in at next f/u.          Nervous and Auditory   Seizure disorder (  HCC)    On Depakote.  Following with neurology.      Alzheimer's disease, unspecified (CODE) (HCC)    We had referred to neurology back in March she is now seeing them and is under further evaluation.  She did not tolerate Aricept and Namenda was ineffective.  They are doing some additional blood testing and imaging and will discuss alternative treatments in the future.      Other Visit Diagnoses     Injury of head, initial encounter          Return next week for suture removal.  Return in about 3 months (around 02/08/2023) for Pre-diabetes.    Nani Gasser, MD

## 2022-11-08 NOTE — Assessment & Plan Note (Signed)
We had referred to neurology back in March she is now seeing them and is under further evaluation.  She did not tolerate Aricept and Namenda was ineffective.  They are doing some additional blood testing and imaging and will discuss alternative treatments in the future.

## 2022-11-08 NOTE — Assessment & Plan Note (Signed)
On Depakote.  Following with neurology.

## 2022-11-08 NOTE — Assessment & Plan Note (Signed)
Her husband was worried that maybe her blood pressure could have dropped low in the middle the night which I think is absolutely reasonable to evaluate.  Recommend checking blood pressure a few times in the morning in the evenings.  He is she is gena come back in about a week for staple removal on her scalp.  So we can take a look with his blood pressures at that time and make an adjustment to her regimen if needed.  Stay well-hydrated.

## 2022-11-08 NOTE — Assessment & Plan Note (Signed)
Plan to recheck A1C in at next f/u.

## 2022-11-08 NOTE — Patient Instructions (Signed)
Check blood pressures at home some in the morning and some in the evening and send those to me next week and we will take a look.  If we need to decrease the blood pressure pill we will.

## 2022-11-15 ENCOUNTER — Telehealth: Payer: Self-pay

## 2022-11-15 ENCOUNTER — Telehealth: Payer: Self-pay | Admitting: Neurology

## 2022-11-15 DIAGNOSIS — I10 Essential (primary) hypertension: Secondary | ICD-10-CM

## 2022-11-15 MED ORDER — LISINOPRIL-HYDROCHLOROTHIAZIDE 10-12.5 MG PO TABS: 1.0000 | ORAL_TABLET | Freq: Every day | ORAL | 0 refills | Status: DC

## 2022-11-15 NOTE — Telephone Encounter (Signed)
Patient's husband called earlier today. Concerned about patient's blood pressure readings. He states her blood pressure is "all over the place". Per husband, these are the following readings since the last office visit. Patient has an appointment scheduled for 11/22/22.  11/08/22 123/66 (PM)  11/09/22 117/93 (AM)  11/10/22 99/89 (AM) 78/85 (MIDDAY) 114/72 (PM)  11/11/22 128/65 (AM) 125/76 (MIDDAY) 112/50, 67 (PM)  11/12/22 (Patient stopped taking her bp meds) 110/50, 68 (AM) 93/55, 77 (MIDDAY) 121/64, 72 (PM)  11/13/22 131/85, 76 (AM) 128/85, 80 (MIDDAY) 144/112, 77 (PM)  11/14/22 118/47, 70 (AM) 117/59, 85 (MIDDAY) 132/60, 77 (PM)  11/15/22 122/56, 68 (AM)

## 2022-11-15 NOTE — Telephone Encounter (Signed)
Attempted to contact both the patient and spouse. No answer. Left a vm msg to both the patient and spouse to return a call back to discuss the provider's note and recommendations. Direct call back info provided.

## 2022-11-15 NOTE — Telephone Encounter (Signed)
Most of the blood pressures actually look really good.  The only couple that I am concerned about are the ones under 100 so the 1 that was in the 70s and 90s.  So I made a slight decrease in her blood pressure pill and sent over a new prescription.  It is still be lisinopril HCTZ but it will be a lower strength.  We can see if that gets rid of some of those low ones but were still seeing mostly 120s.  Meds ordered this encounter  Medications   lisinopril-hydrochlorothiazide (ZESTORETIC) 10-12.5 MG tablet    Sig: Take 1 tablet by mouth daily.    Dispense:  90 tablet    Refill:  0

## 2022-11-15 NOTE — Telephone Encounter (Signed)
[facility] Harvey: [id number] exp. 11/15/22-01/15/23 sent to GI [phone]

## 2022-11-22 ENCOUNTER — Ambulatory Visit (INDEPENDENT_AMBULATORY_CARE_PROVIDER_SITE_OTHER): Payer: No Typology Code available for payment source | Admitting: Family Medicine

## 2022-11-22 ENCOUNTER — Encounter: Payer: Self-pay | Admitting: Family Medicine

## 2022-11-22 VITALS — BP 120/40 | HR 60 | Ht 59.0 in | Wt 144.0 lb

## 2022-11-22 DIAGNOSIS — I10 Essential (primary) hypertension: Secondary | ICD-10-CM

## 2022-11-22 DIAGNOSIS — Z4802 Encounter for removal of sutures: Secondary | ICD-10-CM | POA: Diagnosis not present

## 2022-11-22 NOTE — Progress Notes (Signed)
   Established Patient Office Visit  Subjective   Patient ID: Ashley Haynes, female    DOB: [birth year]  Age: 68 y.o. MRN: [id number]  Chief Complaint  Patient presents with   Suture / Staple Removal    Staple removal    HPI Here for staple  removal. Was supposed to return last week.  Still concerned about blood pressures.  Her husband had noted that her pressures were running a little low and I had sent me a list of her BPs over MyChart.     ROS    Objective:     BP (!) 120/40   Pulse 60   Ht 4\' 11"  (1.499 m)   Wt 144 lb (65.3 kg)   SpO2 99%   BMI 29.08 kg/m    Physical Exam Skin:    Comments: Goal staple removed from left upper scalp area.  Patient tolerated well.      No results found for any visits on 11/22/22.    The 10-year ASCVD risk score (Arnett DK, et al., 2019) is: 9.2%    Assessment & Plan:   Problem List Items Addressed This Visit       Cardiovascular and Mediastinum   Essential hypertension, benign - Primary    Still having low diastolic pressures at home to started the new lower blood pressure pill a few days ago.  Brought in home log.  Encouraged her to schedule a nurse visit in a couple of weeks and bring in her home cuff so we can verify accuracy.  We may still need to consider discontinue the HCTZ component of the pill and possibly increasing the lisinopril.      Other Visit Diagnoses     Removal of staple           Staple remover used to remove single staple from scalp.  Patient tolerated well.  The skin looks dry clean and intact.  To get the area wet.  She is planning on getting her hair done next week.  Return in about 18 days (around 12/10/2022) for Nurse BP Check.    Nani Gasser, MD

## 2022-11-22 NOTE — Assessment & Plan Note (Addendum)
Still having low diastolic pressures at home to started the new lower blood pressure pill a few days ago.  Brought in home log.  Encouraged her to schedule a nurse visit in a couple of weeks and bring in her home cuff so we can verify accuracy.  We may still need to consider discontinue the HCTZ component of the pill and possibly increasing the lisinopril.

## 2022-12-05 ENCOUNTER — Institutional Professional Consult (permissible substitution): Payer: No Typology Code available for payment source | Admitting: Psychology

## 2022-12-05 ENCOUNTER — Ambulatory Visit: Payer: Self-pay

## 2022-12-10 ENCOUNTER — Ambulatory Visit (INDEPENDENT_AMBULATORY_CARE_PROVIDER_SITE_OTHER): Payer: No Typology Code available for payment source

## 2022-12-10 VITALS — BP 118/53 | HR 82

## 2022-12-10 DIAGNOSIS — I10 Essential (primary) hypertension: Secondary | ICD-10-CM | POA: Diagnosis not present

## 2022-12-10 NOTE — Progress Notes (Signed)
   Established Patient Office Visit  Subjective   Patient ID: Ashley Haynes, female    DOB: May 20, 1955  Age: 67 y.o. MRN: [id number]  Chief Complaint  Patient presents with   Hypertension    HPI  [name] is here for blood pressure check. Denies chest pain, shortness of breath or dizziness.   She did not bring in home blood pressure monitor or blood pressure readings.   ROS    Objective:     BP (!) 118/53   Pulse 82   SpO2 100%    Physical Exam   No results found for any visits on 12/10/22.    The 10-year ASCVD risk score (Arnett DK, et al., 2019) is: 9.6%    Assessment & Plan:  Blood pressure check - Blood pressure was better today. She will come back in 2 days and bring blood pressure monitor and readings.   Problem List Items Addressed This Visit       Unprioritized   Essential hypertension, benign - Primary    Return in about 2 days (around 12/12/2022) for nurse visit blood pressure check. Earna Coder, Janalyn Harder, CMA

## 2022-12-13 ENCOUNTER — Encounter: Payer: Self-pay | Admitting: Psychology

## 2022-12-13 ENCOUNTER — Ambulatory Visit (INDEPENDENT_AMBULATORY_CARE_PROVIDER_SITE_OTHER): Payer: No Typology Code available for payment source

## 2022-12-13 VITALS — BP 114/45 | HR 79

## 2022-12-13 DIAGNOSIS — I10 Essential (primary) hypertension: Secondary | ICD-10-CM

## 2022-12-13 NOTE — Progress Notes (Signed)
   Established Patient Office Visit  Subjective   Patient ID: Ashley Haynes, female    DOB: Mar 09, 1955  Age: 68 y.o. MRN: [id number]  Chief Complaint  Patient presents with   Hypertension    HPI  NORHAN TANZILLO is here for blood pressure check. Denies chest pain, shortness of breath or dizziness.   She did bring in her home monitor. It was not in the range of the office monitor.   ROS    Objective:     BP (!) 114/45   Pulse 79   SpO2 100%    Physical Exam   No results found for any visits on 12/13/22.    The 10-year ASCVD risk score (Arnett DK, et al., 2019) is: 9%    Assessment & Plan:  Blood pressure check - Quinnetta would like to change the blood pressure medication to Lisinopril 20 mg without the hydrochlorothiazide.   Problem List Items Addressed This Visit       Unprioritized   Essential hypertension, benign - Primary    Return in about 2 weeks (around 12/27/2022) for nurse visit blood pressure check. Earna Coder, Janalyn Harder, CMA

## 2022-12-17 MED ORDER — LISINOPRIL 20 MG PO TABS: 20.0000 mg | ORAL_TABLET | Freq: Every day | ORAL | 1 refills | Status: DC

## 2022-12-17 NOTE — Addendum Note (Signed)
Addended by: Nani Gasser D on: 12/17/2022 10:42 AM   Modules accepted: Orders

## 2022-12-17 NOTE — Progress Notes (Signed)
Meds ordered this encounter  Medications   lisinopril (ZESTRIL) 20 MG tablet    Sig: Take 1 tablet (20 mg total) by mouth daily.    Dispense:  90 tablet    Refill:  1

## 2022-12-18 ENCOUNTER — Ambulatory Visit (INDEPENDENT_AMBULATORY_CARE_PROVIDER_SITE_OTHER): Payer: No Typology Code available for payment source | Admitting: Neurology

## 2022-12-18 DIAGNOSIS — G309 Alzheimer's disease, unspecified: Secondary | ICD-10-CM

## 2022-12-18 DIAGNOSIS — G40909 Epilepsy, unspecified, not intractable, without status epilepticus: Secondary | ICD-10-CM

## 2022-12-21 ENCOUNTER — Ambulatory Visit
Admission: RE | Admit: 2022-12-21 | Discharge: 2022-12-21 | Disposition: A | Discharge status: Home / Self Care | Payer: No Typology Code available for payment source | Source: Ambulatory Visit | Attending: Neurology | Admitting: Neurology

## 2022-12-21 DIAGNOSIS — G309 Alzheimer's disease, unspecified: Secondary | ICD-10-CM | POA: Diagnosis not present

## 2022-12-21 DIAGNOSIS — G40909 Epilepsy, unspecified, not intractable, without status epilepticus: Secondary | ICD-10-CM

## 2022-12-21 MED ORDER — GADOPICLENOL 0.5 MMOL/ML IV SOLN
6.0000 mL | Freq: Once | INTRAVENOUS | Status: AC | PRN
  Administered 2022-12-21: 6 mL via INTRAVENOUS

## 2022-12-27 ENCOUNTER — Ambulatory Visit (INDEPENDENT_AMBULATORY_CARE_PROVIDER_SITE_OTHER): Payer: No Typology Code available for payment source

## 2022-12-27 VITALS — BP 117/52 | HR 71

## 2022-12-27 DIAGNOSIS — I10 Essential (primary) hypertension: Secondary | ICD-10-CM

## 2022-12-27 NOTE — Progress Notes (Signed)
   Established Patient Office Visit  Subjective   Patient ID: Ashley Haynes, female    DOB: 10-08-1955  Age: 67 y.o. MRN: [id number]  Chief Complaint  Patient presents with   Hypertension    HPI  SHAWON LATCHFORD is here for blood pressure check. Denies chest pain, shortness of breath or dizziness.   ROS    Objective:     BP (!) 117/52   Pulse 71   SpO2 100%    Physical Exam   No results found for any visits on 12/27/22.    The 10-year ASCVD risk score (Arnett DK, et al., 2019) is: 9.4%    Assessment & Plan:  Blood pressure check - Patient advised to continue medications as directed.   Problem List Items Addressed This Visit       Unprioritized   Essential hypertension, benign - Primary    Return in about 3 months (around 03/27/2023) for blood pressure check with Dr Linford Arnold.    Esmond Harps, CMA

## 2023-01-22 ENCOUNTER — Ambulatory Visit (INDEPENDENT_AMBULATORY_CARE_PROVIDER_SITE_OTHER): Payer: No Typology Code available for payment source | Admitting: Family Medicine

## 2023-01-22 ENCOUNTER — Encounter: Payer: Self-pay | Admitting: Family Medicine

## 2023-01-22 VITALS — BP 124/58 | HR 75 | Ht 59.0 in | Wt 147.0 lb

## 2023-01-22 DIAGNOSIS — I10 Essential (primary) hypertension: Secondary | ICD-10-CM

## 2023-01-22 DIAGNOSIS — R748 Abnormal levels of other serum enzymes: Secondary | ICD-10-CM | POA: Diagnosis not present

## 2023-01-22 DIAGNOSIS — R7301 Impaired fasting glucose: Secondary | ICD-10-CM | POA: Diagnosis not present

## 2023-01-22 LAB — POCT GLYCOSYLATED HEMOGLOBIN (HGB A1C): Hemoglobin A1C: 5.4 % (ref 4.0–5.6)

## 2023-01-22 NOTE — Assessment & Plan Note (Signed)
 A1c looks fantastic today at 5.4 which is down from previous of 6.1.  She really has made some good changes trying to work on portions.  Plan to follow-up in 6 months.

## 2023-01-22 NOTE — Progress Notes (Addendum)
   Established Patient Office Visit  Subjective  Patient ID: Ashley Haynes, female    DOB: August 20, 1955  Age: 68 y.o. MRN: [id number]  Chief Complaint  Patient presents with   ifg   Hypertension    HPI  Hypertension- Pt denies chest pain, SOB, dizziness, or heart palpitations.  Taking meds as directed w/o problems.  Denies medication side effects.  We recently discontinued the HCTZ component of her diuretic.  She is exercising occasionally.  Impaired fasting glucose-no increased thirst or urination. No symptoms consistent with hypoglycemia.      ROS    Objective:     BP (!) 124/58   Pulse 75   Ht 4' 11 (1.499 m)   Wt 147 lb (66.7 kg)   SpO2 100%   BMI 29.69 kg/m    Physical Exam Vitals and nursing note reviewed.  Constitutional:      Appearance: Normal appearance.  HENT:     Head: Normocephalic and atraumatic.  Eyes:     Conjunctiva/sclera: Conjunctivae normal.  Cardiovascular:     Rate and Rhythm: Normal rate and regular rhythm.  Pulmonary:     Effort: Pulmonary effort is normal.     Breath sounds: Normal breath sounds.  Skin:    General: Skin is warm and dry.  Neurological:     Mental Status: She is alert.  Psychiatric:        Mood and Affect: Mood normal.     Results for orders placed or performed in visit on 01/22/23  POCT HgB A1C  Result Value Ref Range   Hemoglobin A1C 5.4 4.0 - 5.6 %   HbA1c POC (<> result, manual entry)     HbA1c, POC (prediabetic range)     HbA1c, POC (controlled diabetic range)        The 10-year ASCVD risk score (Arnett DK, et al., 2019) is: 10.6%    Assessment & Plan:   Problem List Items Addressed This Visit       Cardiovascular and Mediastinum   Essential hypertension, benign - Primary   Pressure looks fantastic today continue current regimen due for updated labs.  Encouraged more routine exercise starting with 15 to 20 minutes to help lower blood pressure.  Follow-up in 6 months.      Relevant Orders    CMP14+EGFR     Endocrine   IFG (impaired fasting glucose)   A1c looks fantastic today at 5.4 which is down from previous of 6.1.  She really has made some good changes trying to work on portions.  Plan to follow-up in 6 months.      Relevant Orders   POCT HgB A1C (Completed)   CMP14+EGFR   Other Visit Diagnoses       Elevated liver enzymes       Relevant Orders   CMP14+EGFR       Liver enzymes were elevated when she went to the emergency department back in October.  Will plan to recheck those again today.  He is made some great dietary changes some hoping that the liver function looks better.  Return for Hypertension.    Ashley Byars, MD

## 2023-01-22 NOTE — Assessment & Plan Note (Addendum)
 Pressure looks fantastic today continue current regimen due for updated labs.  Encouraged more routine exercise starting with 15 to 20 minutes to help lower blood pressure.  Follow-up in 6 months.

## 2023-01-23 LAB — CMP14+EGFR
ALT: 19 [IU]/L (ref 0–32)
AST: 20 [IU]/L (ref 0–40)
Albumin: 3.9 g/dL (ref 3.9–4.9)
Alkaline Phosphatase: 74 [IU]/L (ref 44–121)
BUN/Creatinine Ratio: 14 (ref 12–28)
BUN: 11 mg/dL (ref 8–27)
Bilirubin Total: 0.3 mg/dL (ref 0.0–1.2)
CO2: 24 mmol/L (ref 20–29)
Calcium: 9.5 mg/dL (ref 8.7–10.3)
Chloride: 104 mmol/L (ref 96–106)
Creatinine, Ser: 0.8 mg/dL (ref 0.57–1.00)
Globulin, Total: 2.9 g/dL (ref 1.5–4.5)
Glucose: 97 mg/dL (ref 70–99)
Potassium: 3.7 mmol/L (ref 3.5–5.2)
Sodium: 142 mmol/L (ref 134–144)
Total Protein: 6.8 g/dL (ref 6.0–8.5)
eGFR: 80 mL/min/{1.73_m2} (ref >59)

## 2023-01-23 NOTE — Progress Notes (Signed)
 Your lab work is within acceptable range and there are no concerning findings.   ?

## 2023-03-07 ENCOUNTER — Ambulatory Visit (INDEPENDENT_AMBULATORY_CARE_PROVIDER_SITE_OTHER): Payer: No Typology Code available for payment source | Admitting: Neurology

## 2023-03-07 ENCOUNTER — Encounter: Payer: Self-pay | Admitting: Neurology

## 2023-03-07 VITALS — BP 135/68 | HR 60 | Ht 59.0 in | Wt 143.0 lb

## 2023-03-07 DIAGNOSIS — G309 Alzheimer's disease, unspecified: Secondary | ICD-10-CM

## 2023-03-07 DIAGNOSIS — G40909 Epilepsy, unspecified, not intractable, without status epilepticus: Secondary | ICD-10-CM

## 2023-03-07 MED ORDER — MEMANTINE HCL 10 MG PO TABS: 10.0000 mg | ORAL_TABLET | Freq: Two times a day (BID) | ORAL | 11 refills | Status: DC

## 2023-03-07 NOTE — Patient Instructions (Addendum)
 Ashley Haynes  [email or web address]  Namenda [email or web address]  Aricept  [email or web address] [id number] [email or web address]

## 2023-03-07 NOTE — Progress Notes (Signed)
 Chief Complaint  Patient presents with   Room 15    Pt is here with her Husband. Pt's husband states pt's short term memory has declined some. Pt wants to discuss Depakote 500mg .       ASSESSMENT AND PLAN  Ashley Haynes is a 68 y.o. female   Alzheimer's disease  Confirmed by slow progressive short-term memory loss, neuropsychology evaluation,  Also by positive brain amyloid DaTSCAN, plasma beta amyloid 42/40 ratio 0.092  APOE E3/E4 heterozygous,  Had extensive discussion with patient and her husband, went over amyloid removing agent in detail, they both agrees to proceed with [place]  New onset seizure on November 06, 2022  MRI of the brain with without contrast showed generalized atrophy  EEG December 2024 was normal  Tolerating Depakote ER 500 mg every night, no recurrent seizure  Return To Clinic With NP In 3 Months   DIAGNOSTIC DATA (LABS, IMAGING, TESTING) - I reviewed patient records, labs, notes, testing and imaging myself where available.   MEDICAL HISTORY:  RAYLI WIEDERHOLD, seen in request by   Ashley Haynes is a 68 year old female, seen in request by her primary care physician Dr. Sunnie Nielsen, for evaluation of memory loss, right hand tremor, initial evaluation was on August 18, 2019   I reviewed and summarized the referring note.  Past medical history Hypertension,   She retired from Multimedia programmer job in 2019, denies difficulty handling her job, has become less active since retirement, she reported sometimes she walked down the hallway, forgot why she was there, after a while, it will come back to her, sometimes she started thought, difficult to carry through.  She denies difficulty handling daily activity, still driving, pickup painting as her new hobby, sleeps well most of the time, but often woke up multiple times at night to use bathroom, sometimes difficulty falling back to sleep,   Father is at age 86, still sharp minded, does a lot of community  work, keep up his plumbing license, mother died of diabetes and heart disease   Personally reviewed MRI of the brain March 2021 that was normal Laboratory evaluation February 2021: A1c 5.9, no significant abnormality on CBC, hemoglobin 15, CMP, LDL was elevated 143, normal TSH 1.3  EEG was normal.   She has slow decline in functional status over the past few years, MoCA examination 20/06 August 2021, 31 August 2022 Had a formal neuropsychology evaluation by Dr. Vanessa Kick in June 2024, suggested impairment surrounding all aspects of the verbal learning and memory, she was given the diagnosis of mild cognitive disorder  Per family's request, to clarify her diagnosis, she had amyloid PET scan September 2024, which was positive, consistent with presence of moderate to frequent neurotic beta amyloid plaque in the brain,  Laboratory evaluation showed beta amyloid 42:40 ratio was decreased 0.092, highly probability of Alzheimer's disease Neurofilament light chain was within normal limit 2.97, glial fibrillary acid protein 116  UPDATE Nov 07 2022: Today's urgent visit following her ER presentation November 06, 2022, husband heard a loud thump waking him up 5 AM, found patient on the floor unconscious facedown, he was able to help her up, she was incoherent, then had a sudden body stiff, staring spells, making grunting sound, lasting for few minutes, then gradually become responsive again, total episode lasted about 10 to 15 minutes  Patient has no clear recollection of the events Emergency room labs showed negative troponin, CMP, CBC  UPDATE Feb 27th 2025:  She is  here with her husband, concerning the workup result, also long-term treatment plan, her memory continued to decline, but able to participate in discussion,  PET brain amyloid scan in September 2024 was positive, moderate to frequent neurotic beta amyloid plaque in the brain MRI of the brain December 21, 2022 supratentorium cortical  cerebral atrophy APOE E3/E4 heterozygous Alzheimer's panel showed decreased beta amyloid 42/40 ratio, suggest a high probability of Alzheimer's disease, MoCA examination 23/30    PHYSICAL EXAM:   Vitals:   03/07/23 0850  BP: 135/68  Pulse: 60  Weight: 143 lb (64.9 kg)  Height: 4\' 11"  (1.499 m)   Body mass index is 28.88 kg/m.  PHYSICAL EXAMNIATION:  Gen: NAD, conversant, well nourised, well groomed                     Cardiovascular: Regular rate rhythm, no peripheral edema, warm, nontender. Eyes: Conjunctivae clear without exudates or hemorrhage Neck: Supple, no carotid bruits. Pulmonary: Clear to auscultation bilaterally   NEUROLOGICAL EXAM:  MENTAL STATUS: Speech/cognition: Awake, alert, oriented to history taking and casual conversation     03/07/2023    8:53 AM 09/04/2022   12:47 PM 08/01/2021   11:38 AM 08/14/2019    4:30 PM  Montreal Cognitive Assessment   Visuospatial/ Executive (0/5) 4 5 5 5   Naming (0/3) 3 3 3 3   Attention: Read list of digits (0/2) 2 2 2 2   Attention: Read list of letters (0/1) 1 1 1 1   Attention: Serial 7 subtraction starting at 100 (0/3) 3 3 3 2   Language: Repeat phrase (0/2) 2 2 1 2   Language : Fluency (0/1) 1 1 1  0  Abstraction (0/2) 2 2 2 2   Delayed Recall (0/5) 0 0 1 1  Orientation (0/6) 5 4 5 5   Total 23 23 24 23   Adjusted Score (based on education) 23 23 24 23     CRANIAL NERVES: CN II: Visual fields are full to confrontation. Pupils are round equal and briskly reactive to light. CN III, IV, VI: extraocular movement are normal. No ptosis. CN V: Facial sensation is intact to light touch CN VII: Face is symmetric with normal eye closure  CN VIII: Hearing is normal to causal conversation. CN IX, X: Phonation is normal. CN XI: Head turning and shoulder shrug are intact  MOTOR: There is no pronator drift of out-stretched arms. Muscle bulk and tone are normal. Muscle strength is normal.  REFLEXES: Reflexes are 2+ and symmetric  at the biceps, triceps, knees, and ankles. Plantar responses are flexor.  SENSORY: Intact to light touch, pinprick   COORDINATION: There is no trunk or limb dysmetria noted.  GAIT/STANCE: Posture is normal. Gait is steady   REVIEW OF SYSTEMS:  Full 14 system review of systems performed and notable only for as above All other review of systems were negative.   ALLERGIES: Allergies  Allergen Reactions   Aricept [Donepezil] Other (See Comments)    Bad dreams     HOME MEDICATIONS: Current Outpatient Medications  Medication Sig Dispense Refill   atorvastatin (LIPITOR) 80 MG tablet Take 1 tablet (80 mg total) by mouth at bedtime. (Patient taking differently: Take 20 mg by mouth at bedtime. 20 mg) 90 tablet 3   CALCIUM PO Take by mouth.     divalproex (DEPAKOTE ER) 500 MG 24 hr tablet Take 1 tablet (500 mg total) by mouth at bedtime. 30 tablet 11   latanoprost (XALATAN) 0.005 % ophthalmic solution Place 1 drop into both  eyes at bedtime.     lisinopril (ZESTRIL) 20 MG tablet Take 1 tablet (20 mg total) by mouth daily. 90 tablet 1   Flaxseed, Linseed, (FLAX SEEDS PO) Take by mouth. (Patient not taking: Reported on 03/07/2023)     GARLIC PO Take by mouth. (Patient not taking: Reported on 03/07/2023)     MAGNESIUM PO Take by mouth. (Patient not taking: Reported on 03/07/2023)     Multiple Vitamin (MULTIVITAMIN) tablet Take 1 tablet by mouth daily. (Patient not taking: Reported on 03/07/2023)     Omega-3 Fatty Acids (FISH OIL PO) Take by mouth. (Patient not taking: Reported on 03/07/2023)     No current facility-administered medications for this visit.    PAST MEDICAL HISTORY: Past Medical History:  Diagnosis Date   Allergic reaction 02/21/2022   Anemia, unspecified 04/29/2006   Benign neoplasm of skin 04/25/2007   Cardiac risk counseling 03/11/2015   Ten-year ASCVD risk 15.1% (calculated 03/11/2015) given hyperlipidemia, moderately well-controlled blood pressure, age. Prediabetes is  also a concern, as well as increased BMI   Chronic right hip pain 08/18/2019   Essential hypertension, benign 08/17/2010   Former smoker    Quit 09/1988   Hyperlipidemia    Mild cognitive impairment with memory loss 04/07/2020   Prediabetes 03/08/2015   Symptomatic menopausal or female climacteric states 04/29/2006   Centricity Description: HOT FLASHES    PAST SURGICAL HISTORY: Past Surgical History:  Procedure Laterality Date   CESAREAN SECTION     COLONOSCOPY  07/04/2006   [facility]      FAMILY HISTORY: Family History  Problem Relation Age of Onset   Heart disease Mother 77       MI   Diabetes Mother    Glaucoma Mother    Hyperlipidemia Sister    Colon cancer Neg Hx    Colon polyps Neg Hx    Esophageal cancer Neg Hx    Rectal cancer Neg Hx    Stomach cancer Neg Hx     SOCIAL HISTORY: Social History   Socioeconomic History   Marital status: Married    Spouse name: Not on file   Number of children: Not on file   Years of education: 15   Highest education level: Some college, no degree  Occupational History   Occupation: Retired  Tobacco Use   Smoking status: Former    Current packs/day: 0.00    Types: Cigarettes    Quit date: 1990    Years since quitting: 35.1   Smokeless tobacco: Never  Vaping Use   Vaping status: Never Used  Substance and Sexual Activity   Alcohol use: Yes    Comment: occasionally   Drug use: No   Sexual activity: Not Currently  Other Topics Concern   Not on file  Social History Narrative   Not on file   Social Drivers of Health   Financial Resource Strain: Not on file  Food Insecurity: Not on file  Transportation Needs: Not on file  Physical Activity: Not on file  Stress: Not on file  Social Connections: Unknown (11/06/2022)   Received from [facility]   Social Network    Social Network: Not on file  Intimate Partner Violence: Not At Risk (11/06/2022)   Received from [facility]   HITS    Over the last 12 months how  often did your partner physically hurt you?: Never    Over the last 12 months how often did your partner insult you or talk down to you?: Never  Over the last 12 months how often did your partner threaten you with physical harm?: Never    Over the last 12 months how often did your partner scream or curse at you?: Never      Levert Feinstein, M.D. Ph.D.  [facility] [address] [place], [place] [address] Ph: [phone] Fax: [phone]  CC:  Agapito Games, MD [address] [place],  [place] [address]  Agapito Games, MD

## 2023-03-13 ENCOUNTER — Encounter: Payer: Self-pay | Admitting: Neurology

## 2023-03-13 LAB — APOE ALZHEIMER'S RISK

## 2023-03-27 ENCOUNTER — Ambulatory Visit (INDEPENDENT_AMBULATORY_CARE_PROVIDER_SITE_OTHER): Payer: No Typology Code available for payment source | Admitting: Family Medicine

## 2023-03-27 ENCOUNTER — Encounter: Payer: Self-pay | Admitting: Family Medicine

## 2023-03-27 VITALS — BP 138/57 | HR 70 | Ht 59.0 in | Wt 145.0 lb

## 2023-03-27 DIAGNOSIS — G309 Alzheimer's disease, unspecified: Secondary | ICD-10-CM

## 2023-03-27 DIAGNOSIS — I10 Essential (primary) hypertension: Secondary | ICD-10-CM

## 2023-03-27 DIAGNOSIS — L989 Disorder of the skin and subcutaneous tissue, unspecified: Secondary | ICD-10-CM

## 2023-03-27 DIAGNOSIS — G40909 Epilepsy, unspecified, not intractable, without status epilepticus: Secondary | ICD-10-CM

## 2023-03-27 NOTE — Progress Notes (Signed)
 Patient was advised to send in her home BP readings for the next week.

## 2023-03-27 NOTE — Progress Notes (Signed)
 Established Patient Office Visit  Subjective  Patient ID: Ashley Haynes, female    DOB: 10/10/1955  Age: 68 y.o. MRN: [id number]  Chief Complaint  Patient presents with   Hypertension    HPI She is here today to discuss couple of issues.  About a week ago on her right mid back her husband noticed a pigmented area with a little bit of a white scale around it.  This week the scale seems to have resolved but there is still a little small pigmented area is not raised.  Is not painful or bothersome or itchy or bleeding.  Wanted to discussed the Depakote and coming off.  It was started after she was having blood pressure issues got up in the middle the night and then her husband heard a thud and she was on the floor.  He tried to help her up she was very weak and then noticed that she had a staring off spell just for few seconds but then recovered.  At the time they were concerned whether or not it could have been a seizure especially after having fallen and hit in her head but they were not sure so they did go ahead and start her on Depakote.  She has never had a prior history of seizure disorder and has not had any events since the medication was started 6 months ago.  They are wanting to see if they could come off of the medication.  Also wanted to discuss Neurology evaluation for Alzheimers.      ROS    Objective:     BP (!) 138/57   Pulse 70   Ht 4\' 11"  (1.499 m)   Wt 145 lb (65.8 kg)   SpO2 100%   BMI 29.29 kg/m    Physical Exam Vitals reviewed.  Constitutional:      Appearance: Normal appearance.  HENT:     Head: Normocephalic.  Pulmonary:     Effort: Pulmonary effort is normal.  Skin:    Comments: Right mid back there is an most square shaped hyperpigmented lesion no significant scale today it is not palpable.  It is about half of a centimeter in size.  Neurological:     Mental Status: She is alert and oriented to person, place, and time.  Psychiatric:         Mood and Affect: Mood normal.        Behavior: Behavior normal.      No results found for any visits on 03/27/23.    The 10-year ASCVD risk score (Arnett DK, et al., 2019) is: 13.2%    Assessment & Plan:   Problem List Items Addressed This Visit       Cardiovascular and Mediastinum   Essential hypertension, benign - Primary   BP up a little today but normally well controlled at home.         Nervous and Auditory   Seizure disorder [facility] [place] [facility])   We discussed options it was a single event and again in question after immediate head injury whether or not it was a true seizure or not.  We did discuss to do that around that time she was having significant issues with her blood pressure dropping and that that may have actually contributed.  She and her husband are really wanting to discontinue the Depakote she is willing to not drive for several months off of the Depakote just to make sure that she does not have any recurrence.  Her husband  knows to monitor carefully for symptoms.      Alzheimer's disease, unspecified (CODE) (HCC)   She did see neurology for consultation.  Her husband would like to consider having a second opinion if at all possible she did have genetic testing done as well.  She is currently on Namenda.  She did not tolerate Aricept because of bad dreams.  They did consider some of the infusions as a possibility.  But again would prefer to have a second opinion before moving forward with treatment.   Confirmed by slow progressive short-term memory loss, neuropsychology evaluation,             Also by positive brain amyloid DaTSCAN, plasma beta amyloid 42/40 ratio 0.092             APOE E3/E4 heterozygous,      Relevant Orders   Ambulatory referral to Neurology   Other Visit Diagnoses       Skin lesion       Relevant Orders   Fungus Stain       Lesion-skin scraping performed to evaluate for tinea.  If negative then we will just continue to monitor the pigmented  area.  Return in about 4 months (around 07/27/2023) for Hypertension, f/u off seizure medication .    Nani Gasser, MD

## 2023-03-27 NOTE — Assessment & Plan Note (Signed)
 BP up a little today but normally well controlled at home.

## 2023-03-27 NOTE — Assessment & Plan Note (Addendum)
 She did see neurology for consultation.  Her husband would like to consider having a second opinion if at all possible she did have genetic testing done as well.  She is currently on Namenda.  She did not tolerate Aricept because of bad dreams.  They did consider some of the infusions as a possibility.  But again would prefer to have a second opinion before moving forward with treatment.   Confirmed by slow progressive short-term memory loss, neuropsychology evaluation,             Also by positive brain amyloid DaTSCAN, plasma beta amyloid 42/40 ratio 0.092             APOE E3/E4 heterozygous,

## 2023-03-27 NOTE — Patient Instructions (Addendum)
 OK to stop your:  divalproex (DEPAKOTE ER) 500 MG 24 hr tablet

## 2023-03-27 NOTE — Assessment & Plan Note (Signed)
 We discussed options it was a single event and again in question after immediate head injury whether or not it was a true seizure or not.  We did discuss to do that around that time she was having significant issues with her blood pressure dropping and that that may have actually contributed.  She and her husband are really wanting to discontinue the Depakote she is willing to not drive for several months off of the Depakote just to make sure that she does not have any recurrence.  Her husband knows to monitor carefully for symptoms.

## 2023-03-30 ENCOUNTER — Other Ambulatory Visit: Payer: Self-pay | Admitting: Neurology

## 2023-03-30 LAB — FUNGUS STAIN

## 2023-04-01 ENCOUNTER — Encounter: Payer: Self-pay | Admitting: Family Medicine

## 2023-04-01 NOTE — Progress Notes (Signed)
 Ashley Haynes, they did not see any fungal elements.  Just try to keep the area well moisturized with a good dye free perfume free lotion or cream and will continue to keep an eye on that pigmented area.

## 2023-05-28 DIAGNOSIS — H524 Presbyopia: Secondary | ICD-10-CM | POA: Diagnosis not present

## 2023-06-06 ENCOUNTER — Other Ambulatory Visit: Payer: Self-pay | Admitting: *Deleted

## 2023-06-06 MED ORDER — LISINOPRIL 20 MG PO TABS: 20.0000 mg | ORAL_TABLET | Freq: Every day | ORAL | 1 refills | Status: DC

## 2023-07-16 ENCOUNTER — Encounter: Payer: Self-pay | Admitting: Psychology

## 2023-07-17 ENCOUNTER — Ambulatory Visit (INDEPENDENT_AMBULATORY_CARE_PROVIDER_SITE_OTHER): Payer: No Typology Code available for payment source | Admitting: Psychology

## 2023-07-17 ENCOUNTER — Ambulatory Visit: Payer: Self-pay

## 2023-07-17 ENCOUNTER — Encounter: Payer: Self-pay | Admitting: Psychology

## 2023-07-17 DIAGNOSIS — G3184 Mild cognitive impairment, so stated: Secondary | ICD-10-CM

## 2023-07-17 DIAGNOSIS — G309 Alzheimer's disease, unspecified: Secondary | ICD-10-CM

## 2023-07-17 NOTE — Progress Notes (Signed)
 NEUROPSYCHOLOGICAL EVALUATION [place] Department of Neurology  Date of Evaluation: July 17, 2023  Reason for Referral:   Ashley Haynes is a 68 y.o. right-handed African-American female referred by Ashley Haynes, M.D., to characterize her current cognitive functioning and assist with diagnostic clarity and treatment planning in the context of a previous mild neurocognitive disorder diagnosis, concerns for underlying Alzheimer's disease, and concern for progressive cognitive decline.   Assessment and Plan:   Clinical Impression(s): Ashley Haynes pattern of performance is suggestive of severe impairment surrounding all aspects of verbal learning and memory. Further performance weakness/variability was exhibited across semantic fluency and executive functioning. Performances were appropriate relative to age-matched peers across processing speed, attention/concentration, receptive language, phonemic fluency, confrontation naming, visuospatial abilities, and visual learning and memory. Functionally, Ashley Haynes continues to be independent with medication management. While she has not driven lately, this was due to possible seizure activity following a fall in October 2024. Prior to this, no driving-related concerns were noted. As such, given evidence for cognitive dysfunction described above, she continues to best meet diagnostic criteria for a Mild Neurocognitive Disorder (mild cognitive impairment) at the present time. However, given evidence for progressive cognitive decline (see below), she has moved closer towards a dementia designation relative to previous testing.  When compared to her most recent neuropsychological evaluation this past June 2024, mild decline was exhibited across verbal memory, particularly seen across delayed retention rates. Mild decline could also be argued across executive functioning (response inhibition and sematic-based flexibility)  and semantic fluency. Outside of this, Ashley Haynes exhibited relative stability.   Regarding the cause of memory impairment and progressive cognitive decline, concerns for underlying Alzheimer's disease remain. Genetic testing has reportedly revealed a 3/4 APOE genotype, which increases the risk for this illness. Her recent amyloid PET scan also suggested prominent amyloid depositions in all brain lobes, suggesting underlying Alzheimer's disease pathology. Across current testing, despite being able to learn and retain visual information well, Ashley Haynes was fully amnestic (i.e., 0% retention) across 2/3 verbal memory tasks and only exhibited 18% retention across the remaining task. These retention rates (i.e., 0%, 0%, and 18%) do represent a noteworthy decline relative to her previous evaluation (0%, 66%, 53% respectively). She also consistently performed poorly across verbal recognition trials, elevating concerns for both rapid forgetting and a pronounced storage impairment. The combination of these memory patterns is the hallmark characteristic of this illness. Further weakness/variability surrounding semantic fluency would follow typical disease progression. Intact confrontation naming remains encouraging and this, when combined with intact visual memory, continues to suggest that this disease process remains in earlier stages. However, given the combination of genetic information, neuroimaging, testing patterns, and evidence for progressive decline, underlying Alzheimer's disease represents my primary concern at the present time. Continued medical monitoring will be important moving forward.   Recommendations: If there is need to document a formal transition to a dementia diagnostic classification in the future, a repeat evaluation could be considered at that time.   Ashley Haynes has already been prescribed a medication aimed to address memory loss and concerns surrounding Alzheimer's disease (i.e.,  memantine /Namenda ). She is encouraged to continue taking this medication as prescribed assuming negative side effects. As she was unable to tolerate donepezil /Aricept , she could discuss rivastigmine/Exelon with her neurologist if she is unable to tolerate memantine /Namenda . It is important to highlight that these medications have been shown to slow functional decline in some individuals. There is no current treatment which can stop or  reverse cognitive decline when caused by a neurodegenerative illness.   Performance across neurocognitive testing is not a strong predictor of an individual's safety operating a motor vehicle. Should her family wish to pursue a formalized driving evaluation, they could reach out to the following agencies: [employer] in [place]: [phone] [facility]: [phone] [facility]: [phone] [facility]: [phone] or [phone]  Should there be progression of current deficits over time, Ashley Haynes is unlikely to regain any independent living skills lost. Therefore, it is recommended that she remain as involved as possible in all aspects of household chores, finances, and medication management, with supervision to ensure adequate performance. She will likely benefit from the establishment and maintenance of a routine in order to maximize her functional abilities over time.  It will be important for Ashley Haynes to have another person with her when in situations where she may need to process information, weigh the pros and cons of different options, and make decisions, in order to ensure that she fully understands and recalls all information to be considered.  If not already done, Ashley Haynes and her family may want to discuss her wishes regarding durable power of attorney and medical decision making, so that she can have input into these choices. If they require legal assistance with this, long-term care resource access,  or other aspects of estate planning, they could reach out to [employer] at [phone] for a free consultation. Additionally, they may wish to discuss future plans for caretaking and seek out community options for in home/residential care should they become necessary.  Ms. Depaz is encouraged to attend to lifestyle factors for brain health (e.g., regular physical exercise, good nutrition habits and consideration of the MIND-DASH diet, regular participation in cognitively-stimulating activities, and general stress management techniques), which are likely to have benefits for both emotional adjustment and cognition. Optimal control of vascular risk factors (including safe cardiovascular exercise and adherence to dietary recommendations) is encouraged. Continued participation in activities which provide mental stimulation and social interaction is also recommended.   Important information should be provided to Ms. Duque in written format in all instances. This information should be placed in a highly frequented and easily visible location within her home to promote recall. External strategies such as written notes in a consistently used memory journal, visual and nonverbal auditory cues such as a calendar on the refrigerator or appointments with alarm, such as on a cell phone, can also help maximize recall.  Review of Records:   Ms. Kunz completed a comprehensive neuropsychological evaluation with myself on 04/07/2020. Variability across validity testing limited test interpretation. If taken at face value, results suggested primary deficits surrounding encoding (i.e., learning) and retrieval aspects of memory. Retention rates were variable (ranging from 0 to 125%) while performance on yes/no recognition trials was consistently below expectation. She also performed poorly across a computerized task assessing hypothesis testing and adaptability (WCST). However, other aspects of executive functioning  were within normal limits relative to age-matched peers. ADLs were described as intact. She was ultimately diagnosed with a mild neurocognitive disorder. The underlying etiology was unclear at that time. Repeat testing was recommended.  She completed a second evaluation with myself on 07/04/2022. Results suggested an isolated impairment surrounding all aspects of verbal learning and memory. Performances were appropriate relative to age-matched peers across all other assessed cognitive domains. This includes processing speed, attention/concentration, executive functioning, receptive and expressive language, visuospatial abilities, and visual learning and memory. ADL dysfunction was  again denied and her previous mild neurocognitive disorder was maintained. Relative to her previous evaluation,  there was general stability across all assessed cognitive domains. Subtle decline could be observed across retention rates for story and shape-based learning tasks (90% - 125% in 2022 versus 66% - 71% currently), as well as a slightly increased repetition rate across a semantic fluency set shifting task.  Past Medical History:  Diagnosis Date   Allergic reaction 02/21/2022   Anemia, unspecified 04/29/2006   Benign neoplasm of skin 04/25/2007   Cardiac risk counseling 03/11/2015   Ten-year ASCVD risk 15.1% (calculated 03/11/2015) given hyperlipidemia, moderately well-controlled blood pressure, age. Prediabetes is also a concern, as well as increased BMI   Chronic right hip pain 08/18/2019   Essential hypertension, benign 08/17/2010   Fall 11/06/2022   Possible BP related; questionable seizure activity   Former smoker    Quit 09/1988   Hyperlipidemia    IFG (impaired fasting glucose) 03/08/2015   Mild cognitive impairment with memory loss 04/07/2020   Aricept  - bad dreams  Namenda  - not effective.       Confirmed by slow progressive short-term memory loss, neuropsychology evaluation,              Also by positive  brain amyloid DaTSCAN, plasma beta amyloid 42/40 ratio 0.092              APOE E3/E4 heterozygous     Prediabetes 03/08/2015   Symptomatic menopausal or female climacteric states 04/29/2006   Hot flashes    Past Surgical History:  Procedure Laterality Date   CESAREAN SECTION     COLONOSCOPY  07/04/2006   [facility]      Current Outpatient Medications:    atorvastatin  (LIPITOR) 80 MG tablet, Take 1 tablet (80 mg total) by mouth at bedtime. (Patient taking differently: Take 20 mg by mouth at bedtime. 20 mg), Disp: 90 tablet, Rfl: 3   CALCIUM  PO, Take by mouth., Disp: , Rfl:    divalproex  (DEPAKOTE  ER) 500 MG 24 hr tablet, Take 1 tablet (500 mg total) by mouth at bedtime., Disp: 30 tablet, Rfl: 11   latanoprost (XALATAN) 0.005 % ophthalmic solution, Place 1 drop into both eyes at bedtime., Disp: , Rfl:    lisinopril  (ZESTRIL ) 20 MG tablet, Take 1 tablet (20 mg total) by mouth daily., Disp: 90 tablet, Rfl: 1   memantine  (NAMENDA ) 10 MG tablet, TAKE 1 TABLET BY MOUTH TWICE A DAY, Disp: 180 tablet, Rfl: 4   Multiple Vitamin (MULTIVITAMIN) tablet, Take 1 tablet by mouth daily., Disp: , Rfl:      01/18/2021    9:16 AM 07/07/2020    9:33 AM 02/18/2020    9:53 AM 08/18/2019   11:03 AM  MMSE - Mini Mental State Exam  Orientation to time 5 4 5 4   Orientation to Place 5 5 5 5   Registration 3 3 3 3   Attention/ Calculation 5 5 5 4   Recall 2 3 2 2   Language- name 2 objects 2 2 2 2   Language- repeat 1 1 1 1   Language- follow 3 step command 3 3 3 3   Language- read & follow direction 1 1 1 1   Write a sentence 1 1 1 1   Copy design 1 1 1 1   Total score 29 29 29 27       03/07/2023    8:53 AM 09/04/2022   12:47 PM 08/01/2021   11:38 AM 08/14/2019    4:30 PM  Montreal Cognitive Assessment  Visuospatial/ Executive (0/5) 4 5 5 5   Naming (0/3) 3 3 3 3   Attention: Read list of digits (0/2) 2 2 2 2   Attention: Read list of letters (0/1) 1 1 1 1   Attention: Serial 7 subtraction starting at 100 (0/3) 3 3 3 2    Language: Repeat phrase (0/2) 2 2 1 2   Language : Fluency (0/1) 1 1 1  0  Abstraction (0/2) 2 2 2 2   Delayed Recall (0/5) 0 0 1 1  Orientation (0/6) 5 4 5 5   Total 23 23 24 23   Adjusted Score (based on education) 23 23 24 23    Neuroimaging: Brain MRI on 03/16/2019 was unremarkable. Amyloid PET scan on 09/27/2022 suggested diffusely increased florbetapir uptake seen in the cortical cerebral gray matter of the temporal, frontal, occipital, and parietal lobes, with these regions showing clear loss of the normal gray-white contrast, worrisome for underlying Alzheimer's disease. Brain MRI on 12/21/2022 suggested mild cortical cerebral atrophy.   Clinical Interview:   The following information was obtained during a clinical interview with Ms. Hofferber and her husband prior to cognitive testing.  Cognitive Symptoms: Decreased short-term memory: Endorsed. She previously reported trouble recalling the details of previous conversations. However, information was said to generally come to her with time or with the provision of some sort of reminder. Medical records also suggest some concerns surrounding her misplacing objects or entering locations and forgetting her original intention. Overall, she reported feeling as though she could not trust [her] memory any longer. Her husband was in agreement. He also described increased repetition in conversation, with concern for information being lost relatively rapidly. Overall, difficulties were said to have been present for the past 5-6 years. Per her husband, memory decline had mildly progressed relative to her most recent June 2024 evaluation.  Decreased long-term memory: Denied. Decreased attention/concentration: Denied. Reduced processing speed: Endorsed sometimes. Her husband has observed Ms. Meares pause when performing tasks, almost as if she is trying to process the next step. Difficulties with executive functions: Denied. She and her husband denied  trouble with impulsivity or any severe personality changes. He previously described his observation of fairly subtle to mild personality changes in that Ms. Culliton may react to situations in a different way or speak about something in an unexpected or more simple manner. This was stable.  Difficulties with emotion regulation: Denied. Difficulties with receptive language: Denied. Difficulties with word finding: Denied. Decreased visuoperceptual ability: Denied.   Difficulties completing ADLs: Denied. She continues to manage medications independently and without issue. Her husband manages finances, which is longstanding in nature. She stopped driving following a fall in October 2024 with questionable seizure activity. She has not resumed driving since. Prior to this, no driving-related difficulties were noted.   Additional Medical History: History of traumatic brain injury/concussion: Ms. Cosey was seen in the ED on 11/06/2022 following either a fall or syncopal episode and potential head injury. Briefly, her husband heard a loud noise and found Ms. Harvie unconscious on the floor. When she regained consciousness, she was noted to be starting off vacantly, made some gurgling noises, and had trouble with expressive speech for a short period of time. No persisting difficulties were noted. Historically, no other head injuries have been reported.  History of stroke: Denied. History of seizure activity: Unclear. She received a formal seizure diagnosis from her neurologist following her Oct 2024 fall. There remains a lack of clarity surrounding if this represented seizure activity or post-head injury confusion and disorientation. Her husband  felt that seizure activity was unlikely and noted that no similar event has occurred since even after opting to take Ms. Femia off seizure medications.  History of known exposure to toxins: Denied. Symptoms of chronic pain: Denied.  Experience of frequent  headaches/migraines: Denied. Frequent instances of dizziness/vertigo: Denied.   Sensory changes: She wears glasses with positive effect and noted some very mild hearing difficulties only when there is significant background noise. Other sensory changes/difficulties (e.g., taste or smell) were denied.  Balance/coordination difficulties: Denied. She also denied any recent falls.  Other motor difficulties: Endorsed. She reported a mild tremor affecting her right hand. Symptoms were only said to be present while attempting to write or paint. She previously described her handwriting as being far sloppier than in the past. She denied experiencing tremor symptoms while performing other fine motor actions like holding a cup of coffee. These were said to be present for the past several years and have more or less remained stable over time.   Sleep History: Estimated hours obtained each night: 8 hours.  Difficulties falling asleep: Denied. Difficulties staying asleep: Denied outside of waking to use the restroom.  Feels rested and refreshed upon awakening: Endorsed.   History of snoring: Endorsed. History of waking up gasping for air: Denied. Witnessed breath cessation while asleep: Denied.   History of vivid dreaming: Denied. Excessive movement while asleep: Unclear. Her husband reported instances where she might mildly kick or shake her legs while asleep. These were said to be quite infrequent, quite mild, and ongoing for the past few years without worsening.  Instances of acting out her dreams: Denied.  Psychiatric/Behavioral Health History: Depression: She described her current mood as okay, not mopey or sad or anything. She denied to her knowledge any prior mental health concerns or formal diagnoses. Current or remote suicidal ideation, intent, or plan was denied.  Anxiety: Denied. Mania: Denied. Trauma History: Denied. Visual/auditory hallucinations: Denied. Delusional thoughts: Denied.    Tobacco: Denied. Alcohol: She reported previously infrequent alcohol consumption generally with dinner or in social settings. She denied a history of problematic alcohol abuse or dependence.  Recreational drugs: Denied.  Family History: Problem Relation Age of Onset   Heart disease Mother 10       MI   Diabetes Mother    Glaucoma Mother    Hyperlipidemia Sister    Colon cancer Neg Hx    Colon polyps Neg Hx    Esophageal cancer Neg Hx    Rectal cancer Neg Hx    Stomach cancer Neg Hx    This information was confirmed by Ms. Franchot.  Academic/Vocational History: Highest level of educational attainment: 15 years. She graduated from high school and completed three additional years of college. She described herself as a strong (A/B) student in academic settings. No relative weaknesses during early educational settings were identified.  History of developmental delay: Denied. History of grade repetition: Denied. Enrollment in special education courses: Denied. History of LD/ADHD: Denied.   Employment: Retired. 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   Evaluation Results:   Behavioral Observations: Ms. Gangemi was accompanied by her husband, arrived to her appointment on time, and was appropriately dressed and groomed. She appeared alert. Observed gait and station were within normal limits. Gross motor functioning appeared intact upon informal observation and no abnormal movements (e.g., tremors) were noted. Her affect was generally relaxed and positive. Spontaneous speech was fluent and word finding difficulties were not observed during the clinical interview.  Thought processes were coherent, organized, and normal in content. Insight into her cognitive difficulties appeared adequate. However, she may not appreciate the extent of cognitive decline over time, especially with regard to memory functioning.   During testing, sustained attention was appropriate.  Task engagement was adequate and she persisted when challenged. A mild tremor was observed in her right hand while performing tasks with a motor component. Overall, Ms. Nordahl was cooperative with the clinical interview and subsequent testing procedures.   Adequacy of Effort: The validity of neuropsychological testing is limited by the extent to which the individual being tested may be assumed to have exerted adequate effort during testing. Ms. Lorenzetti expressed her intention to perform to the best of her abilities and exhibited adequate task engagement and persistence. Scores across stand-alone and embedded performance validity measures were within expectation. As such, the results of the current evaluation are believed to be a valid representation of Ms. Bokhari's current cognitive functioning.  Test Results: Ms. Kneebone was fairly disoriented at the time of the current evaluation. She incorrectly stated her age (79) and was unable to provide any numbers of her phone number outside of her area code. She was also unable to state the current date, day of the week, or name of the current clinic.   Intellectual abilities based upon educational and vocational attainment were estimated to be in the average range. Premorbid abilities were estimated to be within the average range based upon a single-word reading test.   Processing speed was average to above average. Basic attention was average. More complex attention (e.g., working memory) was also average. Executive functioning was variable, ranging from the exceptionally low to above average normative ranges.  While not directly assessed, receptive language abilities were believed to be intact. Ms. Barre did not exhibit any difficulties comprehending task instructions and answered all questions asked of her appropriately. Assessed expressive language was somewhat variable. Phonemic fluency was average, semantic fluency was well below average to average, and  confrontation naming was above average.    Assessed visuospatial/visuoconstructional abilities were average. Points were lost on her drawing of a clock due to incorrect hand placement.     Learning (i.e., encoding) of novel information was average across a shape learning task but exceptionally low to well below average across all verbal tasks. Spontaneous delayed recall (i.e., retrieval) of previously learned information was above average across a shape learning task but exceptionally low across all verbal tasks. Retention rates were 0% across a list learning task, 0% across a story learning task, 18% across a daily living task, and 117% across a shape learning task. Performance across recognition tasks was average across a shape learning task but exceptionally low to well below average across verbal tasks, suggesting limited evidence for information consolidation.   Results of emotional screening instruments suggested that recent symptoms of generalized anxiety were in the minimal range, while symptoms of depression were within normal limits. A screening instrument assessing recent sleep quality suggested the presence of minimal sleep dysfunction.  Table of Scores:   Note: This summary of test scores accompanies the interpretive report and should not be considered in isolation without reference to the appropriate sections in the text. Descriptors are based on appropriate normative data and may be adjusted based on clinical judgment. Terms such as Within Normal Limits and Outside Normal Limits are used when a more specific description of the test score cannot be determined. Descriptors refer to the current evaluation only.  Percentile - Normative Descriptor > 98 - Exceptionally High 91-97 - Well Above Average 75-90 - Above Average 25-74 - Average 9-24 - Below Average 2-8 - Well Below Average < 2 - Exceptionally Low         Validity: March 2022 June 2024 Current  DESCRIPTOR          NAB EVI: --- --- --- --- Within Normal Limits         Orientation:        Raw Score Raw Score Raw Score Percentile   NAB Orientation, Form 1 27/29 24/29 21/29  --- ---         Intellectual Functioning:        Standard Score Standard Score Standard Score Percentile   Test of Premorbid Functioning: 100 89 99 47 Average         Memory:       NAB Memory Module, Form 1: Standard Score/ T Score Standard Score/ T Score Standard Score/ T Score Percentile   Total Memory Index  69 66 1 Exceptionally Low  List Learning         Total Trials 1-3 16/36 (35) 15/36 (33) 12/36 (27) 1 Exceptionally Low    List B 4/12 (48) 4/12 (48) 4/12 (48) 42 Average    Short Delay Free Recall 2/12 (25) 2/12 (25) 2/12 (25) 1 Exceptionally Low    Long Delay Free Recall 0/12 (24) 0/12 (24) 0/12 (24) <1 Exceptionally Low    Retention Percentage 0 (<19) 0 (<19) 0 (<19) <1 Exceptionally Low    Recognition Discriminability -3 (20) 1 (31) 0 (28) 2 Well Below Average  Shape Learning         Total Trials 1-3 10/27 (34) 14/27 (46) 16/27 (52) 58 Average    Delayed Recall 5/9 (47) 5/9 (47) 7/9 (60) 84 Above Average    Retention Percentage 125 (59) 71 (42) 117 (56) 73 Average    Recognition Discriminability 3 (32) 5 (42) 6 (47) 38 Average  Story Learning         Immediate Recall 35/80 (28) 47/80 (35) 36/80 (28) 2 Well Below Average    Delayed Recall 18/40 (30) 19/40 (32) 0/40 (27) 1 Exceptionally Low    Retention Percentage 90 (50) 66 (38) 0 (<19) <1 Exceptionally Low  Daily Living Memory         Immediate Recall 32/51 (35) 36/51 (39) 28/51 (31) 3 Well Below Average    Delayed Recall 5/17 (19) 8/17 (29) 2/17 (19) <1 Exceptionally Low    Retention Percentage 33 (<19) 53 (27) 18 (<19) <1 Exceptionally Low    Recognition Hits 6/10 (28) 5/10 (20) 5/10 (20) <1 Exceptionally Low         Attention/Executive Function:       Trail Making Test (TMT): Raw Score (T Score) Raw Score (T Score) Raw Score (T Score) Percentile      Part A 34 secs.,  1 error (52) 34 secs.,  0 errors (52) 45 secs.,  0 errors (44) 27 Average    Part B 61 secs.,  1 error (66) 68 secs.,  1 error (62) 80 secs.,  1 error (57) 75 Above Average           Scaled Score Scaled Score Scaled Score Percentile   WAIS-IV Coding: 8 7 8 25  Average         NAB Attention Module, Form 1: T Score T Score T Score Percentile     Digits Forward 43 48 43 25  Average    Digits Backwards 37 49 49 46 Average          Scaled Score Scaled Score Scaled Score Percentile   WAIS-IV Similarities: --- --- 10 50 Average         D-KEFS Color-Word Interference Test: Raw Score (Scaled Score) Raw Score (Scaled Score) Raw Score (Scaled Score) Percentile     Color Naming 37 secs. (8) 32 secs. (10) 29 secs. (12) 75 Above Average    Word Reading 27 secs. (9) 27 secs. (9) 27 secs. (9) 37 Average    Inhibition 66 secs. (11) 77 secs. (9) 63 secs. (11) 63 Average      Total Errors 1 error (11) 2 errors (10) 16 errors (1) <1 Exceptionally Low    Inhibition/Switching 100 secs. (7) 82 secs. (9) 121 secs. (4) 2 Well Below Average      Total Errors 3 errors (10) 3 errors (10) 9 errors (4) 2 Well Below Average         D-KEFS Verbal Fluency Test: Raw Score (Scaled Score) Raw Score (Scaled Score) Raw Score (Scaled Score) Percentile     Letter Total Correct 30 (8) 34 (9) 32 (9) 37 Average    Category Total Correct 28 (7) 30 (8) 35 (10) 50 Average    Category Switching Total Correct 9 (6) 9 (6) 9 (6) 9 Below Average    Category Switching Accuracy 8 (7) 7 (6) 4 (3) 1 Exceptionally Low      Total Set Loss Errors 3 (9) 1 (11) 6 (5) 5 Well Below Average      Total Repetition Errors 1 (12) 5 (8) 6 (7) 16 Below Average         Language:       Verbal Fluency Test: Raw Score (T Score) Raw Score (T Score) Raw Score (T Score) Percentile     Phonemic Fluency (FAS) 30 (48) 34 (51) 32 (48) 42 Average    Animal Fluency 14 (47) 13 (43) 10 (35) 7 Well Below Average          NAB Language  Module, Form 1: T Score T Score T Score Percentile     Naming 30/31 (56) 30/31 (56) 31/31 (57) 75 Above Average         Visuospatial/Visuoconstruction:        Raw Score Raw Score Raw Score Percentile   Clock Drawing: 10/10 8/10 8/10 --- Within Normal Limits         NAB Spatial Module, Form 1: T Score T Score T Score Percentile     Figure Drawing Copy 48 66 55 69 Average          Scaled Score Scaled Score Scaled Score Percentile   WAIS-IV Block Design: 12 8 8 25  Average         Mood and Personality:        Raw Score Raw Score Raw Score Percentile   Beck Depression Inventory - II: 9 4 5  --- Within Normal Limits  PROMIS Anxiety Questionnaire: 15 8 10  --- None to Slight         Additional Questionnaires:        Raw Score Raw Score Raw Score Percentile   PROMIS Sleep Disturbance Questionnaire: 23 16 10  --- None to Slight   Informed Consent and Coding/Compliance:   The current evaluation represents a clinical evaluation for the purposes previously outlined by the referral source and is in no way reflective of a forensic evaluation.   Ms.  Spackman was provided with a verbal description of the nature and purpose of the present neuropsychological evaluation. Also reviewed were the foreseeable risks and/or discomforts and benefits of the procedure, limits of confidentiality, and mandatory reporting requirements of this provider. The patient was given the opportunity to ask questions and receive answers about the evaluation. Oral consent to participate was provided by the patient.   This evaluation was conducted by Arthea KYM Maryland, Ph.D., ABPP-CN, board certified clinical neuropsychologist. Ms. Estala completed a clinical interview with Dr. Maryland, billed as one unit [phone], and 140 minutes of cognitive testing and scoring, billed as one unit [phone] and four additional units 96137. As a separate and discrete service, one unit [phone] and two units 96133 (160 minutes) were billed for Dr. Loralee time spent in  interpretation and report writing.

## 2023-07-24 ENCOUNTER — Ambulatory Visit (INDEPENDENT_AMBULATORY_CARE_PROVIDER_SITE_OTHER): Payer: No Typology Code available for payment source | Admitting: Psychology

## 2023-07-24 DIAGNOSIS — G3184 Mild cognitive impairment, so stated: Secondary | ICD-10-CM

## 2023-07-24 DIAGNOSIS — G309 Alzheimer's disease, unspecified: Secondary | ICD-10-CM | POA: Diagnosis not present

## 2023-07-24 NOTE — Progress Notes (Signed)
   Neuropsychology Feedback Session Jolynn DEL. [facility] [place] Department of Neurology  Reason for Referral:   Ashley Haynes is a 68 y.o. right-handed African-American female referred by Dorothyann Byars, M.D., to characterize her current cognitive functioning and assist with diagnostic clarity and treatment planning in the context of a previous mild neurocognitive disorder diagnosis, concerns for underlying Alzheimer's disease, and concern for progressive cognitive decline.   Feedback:   Ashley Haynes completed a comprehensive neuropsychological evaluation on 07/17/2023. Please refer to that encounter for the full report and recommendations. Briefly, results suggested severe impairment surrounding all aspects of verbal learning and memory. Further performance weakness/variability was exhibited across semantic fluency and executive functioning. Performances were appropriate relative to age-matched peers across processing speed, attention/concentration, receptive language, phonemic fluency, confrontation naming, visuospatial abilities, and visual learning and memory. When compared to her most recent neuropsychological evaluation this past June 2024, mild decline was exhibited across verbal memory, particularly seen across delayed retention rates. Mild decline could also be argued across executive functioning (response inhibition and sematic-based flexibility) and semantic fluency. Outside of this, Ashley Haynes exhibited relative stability. Regarding the cause of memory impairment and progressive cognitive decline, concerns for underlying Alzheimer's disease remain. Genetic testing has reportedly revealed a 3/4 APOE genotype, which increases the risk for this illness. Her recent amyloid PET scan also suggested prominent amyloid depositions in all brain lobes, suggesting underlying Alzheimer's disease pathology. Across current testing, despite being able to learn and retain visual information well,  Ashley Haynes was fully amnestic (i.e., 0% retention) across 2/3 verbal memory tasks and only exhibited 18% retention across the remaining task. These retention rates (i.e., 0%, 0%, and 18%) do represent a noteworthy decline relative to her previous evaluation (0%, 66%, 53% respectively). She also consistently performed poorly across verbal recognition trials, elevating concerns for both rapid forgetting and a pronounced storage impairment. The combination of these memory patterns is the hallmark characteristic of this illness. Further weakness/variability surrounding semantic fluency would follow typical disease progression. Intact confrontation naming remains encouraging and this, when combined with intact visual memory, continues to suggest that this disease process remains in earlier stages. However, given the combination of genetic information, neuroimaging, testing patterns, and evidence for progressive decline, underlying Alzheimer's disease represents my primary concern at the present time. Continued medical monitoring will be important moving forward.   Ashley Haynes was accompanied by her husband during the current feedback session. Content of the current session focused on the results of her neuropsychological evaluation. Ashley Haynes was given the opportunity to ask questions and her questions were answered. She was encouraged to reach out should additional questions arise. A copy of her report was provided at the conclusion of the visit.      One unit 96132 (32 minutes) was billed for Dr. Loralee time spent preparing for, conducting, and documenting the current feedback session with Ashley Haynes.

## 2023-07-25 ENCOUNTER — Ambulatory Visit (INDEPENDENT_AMBULATORY_CARE_PROVIDER_SITE_OTHER): Admitting: Family Medicine

## 2023-07-25 ENCOUNTER — Encounter: Payer: Self-pay | Admitting: Family Medicine

## 2023-07-25 VITALS — BP 125/52 | HR 69 | Ht 59.0 in | Wt 141.0 lb

## 2023-07-25 DIAGNOSIS — R7301 Impaired fasting glucose: Secondary | ICD-10-CM

## 2023-07-25 DIAGNOSIS — G3184 Mild cognitive impairment, so stated: Secondary | ICD-10-CM

## 2023-07-25 DIAGNOSIS — G309 Alzheimer's disease, unspecified: Secondary | ICD-10-CM

## 2023-07-25 DIAGNOSIS — R195 Other fecal abnormalities: Secondary | ICD-10-CM | POA: Diagnosis not present

## 2023-07-25 DIAGNOSIS — I10 Essential (primary) hypertension: Secondary | ICD-10-CM

## 2023-07-25 DIAGNOSIS — E785 Hyperlipidemia, unspecified: Secondary | ICD-10-CM | POA: Diagnosis not present

## 2023-07-25 NOTE — Assessment & Plan Note (Signed)
 His A1c in January looks phenomenal so we will plan to just recheck again in 1 year from the last 1.

## 2023-07-25 NOTE — Progress Notes (Signed)
 Established Patient Office Visit  Subjective  Patient ID: Ashley Haynes, female    DOB: 09-23-1955  Age: 68 y.o. MRN: [id number]  Chief Complaint  Patient presents with   Medical Management of Chronic Issues    HPI  She is doing well still following with [facility] in fact she just had her repeat memory assessment with full evaluation.  She is currently on amantadine she did have a little decline in short-term memory though everything else seemed pretty stable.  She also reports that she still doing well off of the seizure medication she has been off of it for quite some time now and has not had any recurrence.  She also reports the last couple days she has had some more loose frequent bowel movements no bloody stools no fevers or chills.  They did go out to eat quite a bit when family was in town about a week or so ago.    Lab Results  Component Value Date   HGBA1C 5.4 01/22/2023       ROS    Objective:     BP (!) 125/52   Pulse 69   Ht 4' 11 (1.499 m)   Wt 141 lb (64 kg)   SpO2 99%   BMI 28.48 kg/m    Physical Exam Vitals and nursing note reviewed.  Constitutional:      Appearance: Normal appearance.  HENT:     Head: Normocephalic and atraumatic.  Eyes:     Conjunctiva/sclera: Conjunctivae normal.  Cardiovascular:     Rate and Rhythm: Normal rate and regular rhythm.  Pulmonary:     Effort: Pulmonary effort is normal.     Breath sounds: Normal breath sounds.  Skin:    General: Skin is warm and dry.  Neurological:     Mental Status: She is alert.  Psychiatric:        Mood and Affect: Mood normal.      No results found for any visits on 07/25/23.    The 10-year ASCVD risk score (Arnett DK, et al., 2019) is: 24%    Assessment & Plan:   Problem List Items Addressed This Visit       Cardiovascular and Mediastinum   Essential hypertension, benign - Primary   Initial blood pressure little elevated will recheck before she leaves  today if it still in the 130s then going to have her check blood pressures at home over the next couple of weeks and then let me know so that we if we need to adjust the lisinopril  up we can.  Pete blood pressure looks fantastic.  Continue current regimen.      Relevant Orders   CMP14+EGFR   Lipid panel   CBC     Endocrine   IFG (impaired fasting glucose)   His A1c in January looks phenomenal so we will plan to just recheck again in 1 year from the last 1.      Relevant Orders   CMP14+EGFR   Lipid panel   CBC     Nervous and Auditory   Mild cognitive impairment due to Alzheimer's disease   Aricept  - bad dreams Namenda  - not effective.     Confirmed by slow progressive short-term memory loss, neuropsychology evaluation,             Also by positive brain amyloid DaTSCAN, plasma beta amyloid 42/40 ratio 0.092             APOE E3/E4 heterozygou  Does have an upcoming appointment with the memory clinic at [facility].  It should be in August they have been waiting over 6 months for that appointment just for a second opinion to make sure that they are doing everything that they can for now she is currently on amantadine 10 mg.        Other   Hyperlipidemia   Due for updated lipid panel for better restratification.  Currently on atorvastatin       Relevant Orders   CMP14+EGFR   Lipid panel   CBC   Other Visit Diagnoses       Loose stools           Loose stools-we discussed just may be monitoring it for the next couple of days it could be shift in diet.  Maybe even viral illness but it does not seem like it is more severe with cramping and watery diarrhea which is reassuring.  But if not improving then please let me know.  They have eaten out recently otherwise no medication changes etc.  Return in about 6 months (around 01/25/2024).    Dorothyann Byars, MD

## 2023-07-25 NOTE — Assessment & Plan Note (Addendum)
 Initial blood pressure little elevated will recheck before she leaves today if it still in the 130s then going to have her check blood pressures at home over the next couple of weeks and then let me know so that we if we need to adjust the lisinopril  up we can.  Pete blood pressure looks fantastic.  Continue current regimen.

## 2023-07-25 NOTE — Assessment & Plan Note (Signed)
 Aricept  - bad dreams Namenda  - not effective.     Confirmed by slow progressive short-term memory loss, neuropsychology evaluation,             Also by positive brain amyloid DaTSCAN, plasma beta amyloid 42/40 ratio 0.092             APOE E3/E4 heterozygou    Does have an upcoming appointment with the memory clinic at [facility].  It should be in August they have been waiting over 6 months for that appointment just for a second opinion to make sure that they are doing everything that they can for now she is currently on amantadine 10 mg.

## 2023-07-25 NOTE — Assessment & Plan Note (Signed)
 Due for updated lipid panel for better restratification.  Currently on atorvastatin 

## 2023-07-26 ENCOUNTER — Ambulatory Visit: Payer: Self-pay | Admitting: Family Medicine

## 2023-07-26 LAB — CMP14+EGFR
ALT: 33 IU/L — ABNORMAL HIGH (ref 0–32)
AST: 37 IU/L (ref 0–40)
Albumin: 4.1 g/dL (ref 3.9–4.9)
Alkaline Phosphatase: 111 IU/L (ref 44–121)
BUN/Creatinine Ratio: 16 (ref 12–28)
BUN: 13 mg/dL (ref 8–27)
Bilirubin Total: 0.3 mg/dL (ref 0.0–1.2)
CO2: 21 mmol/L (ref 20–29)
Calcium: 9.8 mg/dL (ref 8.7–10.3)
Chloride: 100 mmol/L (ref 96–106)
Creatinine, Ser: 0.81 mg/dL (ref 0.57–1.00)
Globulin, Total: 3.2 g/dL (ref 1.5–4.5)
Glucose: 77 mg/dL (ref 70–99)
Potassium: 4 mmol/L (ref 3.5–5.2)
Sodium: 139 mmol/L (ref 134–144)
Total Protein: 7.3 g/dL (ref 6.0–8.5)
eGFR: 79 mL/min/1.73 (ref >59)

## 2023-07-26 LAB — CBC
Hematocrit: 44 % (ref 34.0–46.6)
Hemoglobin: 14.3 g/dL (ref 11.1–15.9)
MCH: 28.5 pg (ref 26.6–33.0)
MCHC: 32.5 g/dL (ref 31.5–35.7)
MCV: 88 fL (ref 79–97)
Platelets: 210 x10E3/uL (ref 150–450)
RBC: 5.02 x10E6/uL (ref 3.77–5.28)
RDW: 13.6 % (ref 11.7–15.4)
WBC: 5.3 x10E3/uL (ref 3.4–10.8)

## 2023-07-26 LAB — LIPID PANEL
Chol/HDL Ratio: 2.8 ratio (ref 0.0–4.4)
Cholesterol, Total: 170 mg/dL (ref 100–199)
HDL: 61 mg/dL (ref >39)
LDL Chol Calc (NIH): 96 mg/dL (ref 0–99)
Triglycerides: 70 mg/dL (ref 0–149)
VLDL Cholesterol Cal: 13 mg/dL (ref 5–40)

## 2023-07-26 NOTE — Progress Notes (Signed)
 Hi Koa, ALT which is a liver enzyme is off just by 1 point.  When I look back at your old labs this has happened before.  It is not in a worrisome range but we will just keep an eye on it.  Cholesterol overall looks okay LDL is under 100 which is good.  No anemia.  Lets try to get you scheduled for your telephone call with our Medicare wellness nurse if you have time to do that sometime this month that would be wonderful.

## 2023-07-30 ENCOUNTER — Ambulatory Visit

## 2023-07-30 VITALS — Ht 59.0 in | Wt 141.0 lb

## 2023-07-30 DIAGNOSIS — Z780 Asymptomatic menopausal state: Secondary | ICD-10-CM

## 2023-07-30 DIAGNOSIS — Z13820 Encounter for screening for osteoporosis: Secondary | ICD-10-CM

## 2023-07-30 DIAGNOSIS — Z0000 Encounter for general adult medical examination without abnormal findings: Secondary | ICD-10-CM

## 2023-07-30 DIAGNOSIS — Z1231 Encounter for screening mammogram for malignant neoplasm of breast: Secondary | ICD-10-CM

## 2023-07-30 NOTE — Progress Notes (Signed)
 Subjective:   Ashley Haynes is a 68 y.o. female who presents for Medicare Annual (Subsequent) preventive examination.  Visit Complete: Virtual I connected with  Ashley Haynes on 07/30/23 by a audio enabled telemedicine application and verified that I am speaking with the correct person using two identifiers.  Patient Location: Home  Provider Location: Office/Clinic  I discussed the limitations of evaluation and management by telemedicine. The patient expressed understanding and agreed to proceed.  Vital Signs: Because this visit was a virtual/telehealth visit, some criteria may be missing or patient reported. Any vitals not documented were not able to be obtained and vitals that have been documented are patient reported.  Patient Medicare AWV questionnaire was completed by the patient on n/a; I have confirmed that all information answered by patient is correct and no changes since this date.  Cardiac Risk Factors include: advanced age (>49men, >36 women);dyslipidemia     Objective:    Today's Vitals   07/30/23 0757  Weight: 141 lb (64 kg)  Height: 4' 11 (1.499 m)   Body mass index is 28.48 kg/m.     07/30/2023    8:08 AM  Advanced Directives  Does Patient Have a Medical Advance Directive? Yes  Type of Estate agent of [place];Living will  Does patient want to make changes to medical advance directive? No - Patient declined  Copy of Healthcare Power of Attorney in Chart? No - copy requested    Current Medications (verified) Outpatient Encounter Medications as of 07/30/2023  Medication Sig   atorvastatin  (LIPITOR) 80 MG tablet Take 1 tablet (80 mg total) by mouth at bedtime.   CALCIUM  PO Take by mouth.   latanoprost (XALATAN) 0.005 % ophthalmic solution Place 1 drop into both eyes at bedtime.   lisinopril  (ZESTRIL ) 20 MG tablet Take 1 tablet (20 mg total) by mouth daily.   memantine  (NAMENDA ) 10 MG tablet TAKE 1 TABLET BY MOUTH TWICE A DAY    Multiple Vitamin (MULTIVITAMIN) tablet Take 1 tablet by mouth daily.   No facility-administered encounter medications on file as of 07/30/2023.    Allergies (verified) Aricept  [donepezil ]   History: Past Medical History:  Diagnosis Date   Allergic reaction 02/21/2022   Anemia, unspecified 04/29/2006   Benign neoplasm of skin 04/25/2007   Cardiac risk counseling 03/11/2015   Ten-year ASCVD risk 15.1% (calculated 03/11/2015) given hyperlipidemia, moderately well-controlled blood pressure, age. Prediabetes is also a concern, as well as increased BMI   Chronic right hip pain 08/18/2019   Essential hypertension, benign 08/17/2010   Fall 11/06/2022   Possible BP related; questionable seizure activity   Former smoker    Quit 09/1988   Hyperlipidemia    IFG (impaired fasting glucose) 03/08/2015   Mild cognitive impairment due to Alzheimer's disease 04/07/2020   Aricept  - bad dreams  Namenda  - not effective.       Confirmed by slow progressive short-term memory loss, neuropsychology evaluation,              Also by positive brain amyloid DaTSCAN, plasma beta amyloid 42/40 ratio 0.092              APOE E3/E4 heterozygous     Prediabetes 03/08/2015   Symptomatic menopausal or female climacteric states 04/29/2006   Hot flashes   Past Surgical History:  Procedure Laterality Date   CESAREAN SECTION     COLONOSCOPY  07/04/2006   ltcs     Family History  Problem Relation Age of Onset  Heart disease Mother 48       MI   Diabetes Mother    Glaucoma Mother    Hyperlipidemia Sister    Colon cancer Neg Hx    Colon polyps Neg Hx    Esophageal cancer Neg Hx    Rectal cancer Neg Hx    Stomach cancer Neg Hx    Social History   Socioeconomic History   Marital status: Married    Spouse name: Not on file   Number of children: Not on file   Years of education: 15   Highest education level: Some college, no degree  Occupational History   Occupation: Retired  Tobacco Use   Smoking status:  Former    Current packs/day: 0.00    Types: Cigarettes    Quit date: 1990    Years since quitting: 35.5   Smokeless tobacco: Never  Vaping Use   Vaping status: Never Used  Substance and Sexual Activity   Alcohol use: Not Currently   Drug use: No   Sexual activity: Not Currently  Other Topics Concern   Not on file  Social History Narrative   Ashley Haynes lives with her husband. She enjoys painting and reading.    Social Drivers of Corporate investment banker Strain: Low Risk  (07/30/2023)   Overall Financial Resource Strain (CARDIA)    Difficulty of Paying Living Expenses: Not hard at all  Food Insecurity: No Food Insecurity (07/30/2023)   Hunger Vital Sign    Worried About Running Out of Food in the Last Year: Never true    Ran Out of Food in the Last Year: Never true  Transportation Needs: No Transportation Needs (07/30/2023)   PRAPARE - Administrator, Civil Service (Medical): No    Lack of Transportation (Non-Medical): No  Physical Activity: Sufficiently Active (07/30/2023)   Exercise Vital Sign    Days of Exercise per Week: 3 days    Minutes of Exercise per Session: 50 min  Stress: No Stress Concern Present (07/30/2023)   [employer] of Occupational Health - Occupational Stress Questionnaire    Feeling of Stress: Not at all  Social Connections: Moderately Isolated (07/30/2023)   Social Connection and Isolation Panel    Frequency of Communication with Friends and Family: More than three times a week    Frequency of Social Gatherings with Friends and Family: Three times a week    Attends Religious Services: Never    Active Member of Clubs or Organizations: No    Attends Banker Meetings: Never    Marital Status: Married    Tobacco Counseling Counseling given: Not Answered   Clinical Intake:  Pre-visit preparation completed: Yes  Pain : No/denies pain     BMI - recorded: 28.48 Nutritional Status: BMI 25 -29 Overweight Nutritional Risks:  None Diabetes: No  How often do you need to have someone help you when you read instructions, pamphlets, or other written materials from your doctor or pharmacy?: 1 - Never What is the last grade level you completed in school?: 15  Interpreter Needed?: No      Activities of Daily Living    07/30/2023    7:59 AM  In your present state of health, do you have any difficulty performing the following activities:  Hearing? 0  Vision? 0  Difficulty concentrating or making decisions? 1  Walking or climbing stairs? 0  Dressing or bathing? 0  Doing errands, shopping? 0  Preparing Food and eating ? N  Using the Toilet? N  In the past six months, have you accidently leaked urine? N  Do you have problems with loss of bowel control? N  Managing your Medications? N  Managing your Finances? N  Housekeeping or managing your Housekeeping? N    Patient Care Team: Alvan Dorothyann BIRCH, MD as PCP - General (Family Medicine) Onita Duos, MD as Consulting Physician (Neurology) Richie Hussar, PhD as Consulting Physician (Psychology)  Indicate any recent Medical Services you may have received from other than [facility] providers in the past year (date may be approximate).     Assessment:   This is a routine wellness examination for Ashley Haynes.  Hearing/Vision screen No results found.   Goals Addressed             This Visit's Progress    Patient Stated       Patient states she would like to lose weight.        Depression Screen    07/30/2023    8:06 AM 11/22/2022   10:04 AM 11/08/2022    8:37 AM 03/27/2022    8:17 AM 02/21/2022    4:13 PM 08/14/2019    9:31 AM 05/27/2018    3:44 PM  PHQ 2/9 Scores  PHQ - 2 Score 0 0 0 0 1 1 0  PHQ- 9 Score      1 2    Fall Risk    07/30/2023    8:09 AM 11/08/2022    8:36 AM 03/27/2022    8:17 AM 02/21/2022    4:13 PM 08/14/2019    9:31 AM  Fall Risk   Falls in the past year? 0 1 0 0 1  Number falls in past yr: 0 0 0 0 0  Injury with Fall? 0 1 0 0 0   Risk for fall due to : No Fall Risks Mental status change No Fall Risks No Fall Risks   Follow up Falls evaluation completed Falls evaluation completed Falls evaluation completed Falls evaluation completed     MEDICARE RISK AT HOME: Medicare Risk at Home Any stairs in or around the home?: Yes If so, are there any without handrails?: Yes Home free of loose throw rugs in walkways, pet beds, electrical cords, etc?: Yes Adequate lighting in your home to reduce risk of falls?: Yes Life alert?: No Use of a cane, walker or w/c?: No Grab bars in the bathroom?: Yes Shower chair or bench in shower?: No Elevated toilet seat or a handicapped toilet?: No  TIMED UP AND GO:  Was the test performed?  No    Cognitive Function:    01/18/2021    9:16 AM 07/07/2020    9:33 AM 02/18/2020    9:53 AM 08/18/2019   11:03 AM  MMSE - Mini Mental State Exam  Orientation to time 5 4 5 4   Orientation to Place 5 5 5 5   Registration 3 3 3 3   Attention/ Calculation 5 5 5 4   Recall 2 3 2 2   Language- name 2 objects 2 2 2 2   Language- repeat 1 1 1 1   Language- follow 3 step command 3 3 3 3   Language- read & follow direction 1 1 1 1   Write a sentence 1 1 1 1   Copy design 1 1 1 1   Total score 29 29 29 27       03/07/2023    8:53 AM 09/04/2022   12:47 PM 08/01/2021   11:38 AM 08/14/2019    4:30 PM  Montreal Cognitive Assessment   Visuospatial/ Executive (0/5) 4 5 5 5   Naming (0/3) 3 3 3 3   Attention: Read list of digits (0/2) 2 2 2 2   Attention: Read list of letters (0/1) 1 1 1 1   Attention: Serial 7 subtraction starting at 100 (0/3) 3 3 3 2   Language: Repeat phrase (0/2) 2 2 1 2   Language : Fluency (0/1) 1 1 1  0  Abstraction (0/2) 2 2 2 2   Delayed Recall (0/5) 0 0 1 1  Orientation (0/6) 5 4 5 5   Total 23 23 24 23   Adjusted Score (based on education) 23 23 24 23       07/30/2023    8:10 AM  6CIT Screen  What Year? 0 points  What month? 0 points  What time? 0 points  Count back from 20 0 points   Months in reverse 0 points  Repeat phrase 10 points  Total Score 10 points    Immunizations Immunization History  Administered Date(s) Administered   Fluad Quad(high Dose 65+) 11/03/2021   Influenza, High Dose Seasonal PF 10/31/2022   Influenza,inj,Quad PF,6+ Mos 11/30/2014, 10/28/2018   Influenza-Unspecified 10/30/2019   PFIZER(Purple Top)SARS-COV-2 Vaccination 03/09/2019, 03/31/2019, 10/30/2019, 10/26/2020   PNEUMOCOCCAL CONJUGATE-20 09/05/2020   Pfizer Covid-19 Vaccine Bivalent Booster 52yrs & up 11/03/2021   Pfizer(Comirnaty)Fall Seasonal Vaccine 12 years and older 10/31/2022   Td 05/27/2006   Tdap 05/27/2018    TDAP status: Up to date  Flu Vaccine status: Up to date  Pneumococcal vaccine status: Up to date  Covid-19 vaccine status: Completed vaccines  Qualifies for Shingles Vaccine? Yes   Zostavax completed No   Shingrix Completed?: No.    Education has been provided regarding the importance of this vaccine. Patient has been advised to call insurance company to determine out of pocket expense if they have not yet received this vaccine. Advised may also receive vaccine at local pharmacy or Health Dept. Verbalized acceptance and understanding.  Screening Tests Health Maintenance  Topic Date Due   Zoster Vaccines- Shingrix (1 of 2) Never done   COVID-19 Vaccine (7 - 2024-25 season) 05/01/2023   INFLUENZA VACCINE  08/09/2023   MAMMOGRAM  07/24/2024   Medicare Annual Wellness (AWV)  07/29/2024   DTaP/Tdap/Td (3 - Td or Tdap) 05/26/2028   Colonoscopy  07/20/2028   Pneumococcal Vaccine: 50+ Years  Completed   DEXA SCAN  Completed   Hepatitis C Screening  Completed   Hepatitis B Vaccines  Aged Out   HPV VACCINES  Aged Out   Meningococcal B Vaccine  Aged Out    Health Maintenance  Health Maintenance Due  Topic Date Due   Zoster Vaccines- Shingrix (1 of 2) Never done   COVID-19 Vaccine (7 - 2024-25 season) 05/01/2023    Colorectal cancer screening: Type of  screening: Colonoscopy. Completed 07/21/2018. Repeat every 10 years  Mammogram status: Ordered 07/30/2023. Pt provided with contact info and advised to call to schedule appt.   Bone Density status: Ordered 07/30/2023. Pt provided with contact info and advised to call to schedule appt.  Lung Cancer Screening: (Low Dose CT Chest recommended if Age 64-80 years, 20 pack-year currently smoking OR have quit w/in 15years.) does not qualify.   Lung Cancer Screening Referral: n/a  Additional Screening:  Hepatitis C Screening: does qualify; Completed 11/30/2014  Vision Screening: Recommended annual ophthalmology exams for early detection of glaucoma and other disorders of the eye. Is the patient up to date with their annual eye exam?  Yes  Who is the provider or what is the name of the office in which the patient attends annual eye exams? [email or web address] If pt is not established with a provider, would they like to be referred to a provider to establish care? N/a.   Dental Screening: Recommended annual dental exams for proper oral hygiene   Community Resource Referral / Chronic Care Management: CRR required this visit?  No   CCM required this visit?  No     Plan:     I have personally reviewed and noted the following in the patient's chart:   Medical and social history Use of alcohol, tobacco or illicit drugs  Current medications and supplements including opioid prescriptions. Patient is not currently taking opioid prescriptions. Functional ability and status Nutritional status Physical activity Advanced directives List of other physicians Hospitalizations, surgeries, and ER # 1 visits in previous 12 months Vitals Screenings to include cognitive, depression, and falls Referrals and appointments  In addition, I have reviewed and discussed with patient certain preventive protocols, quality metrics, and best practice recommendations. A written personalized care plan for preventive  services as well as general preventive health recommendations were provided to patient.     Bonny Jon Mayor, CMA   07/30/2023   After Visit Summary: (MyChart) Due to this being a telephonic visit, the after visit summary with patients personalized plan was offered to patient via MyChart   Nurse Notes:   Ashley Haynes is a 68 y.o. female patient of Dorothyann Byars, MD who had a Medicare Annual Wellness Visit today via telephone. She reports that he is socially active and does interact with friends/family regularly. She is moderately physically active.

## 2023-07-30 NOTE — Patient Instructions (Signed)
  Ashley Haynes , Thank you for taking time to come for your Medicare Wellness Visit. I appreciate your ongoing commitment to your health goals. Please review the following plan we discussed and let me know if I can assist you in the future.   These are the goals we discussed:  Goals      Patient Stated     Patient states she would like to lose weight.         This is a list of the screening recommended for you and due dates:  Health Maintenance  Topic Date Due   Zoster (Shingles) Vaccine (1 of 2) Never done   COVID-19 Vaccine (7 - 2024-25 season) 05/01/2023   Flu Shot  08/09/2023   Mammogram  07/24/2024   Medicare Annual Wellness Visit  07/29/2024   DTaP/Tdap/Td vaccine (3 - Td or Tdap) 05/26/2028   Colon Cancer Screening  07/20/2028   Pneumococcal Vaccine for age over 67  Completed   DEXA scan (bone density measurement)  Completed   Hepatitis C Screening  Completed   Hepatitis B Vaccine  Aged Out   HPV Vaccine  Aged Out   Meningitis B Vaccine  Aged Out

## 2023-08-12 ENCOUNTER — Other Ambulatory Visit: Payer: Self-pay | Admitting: Family Medicine

## 2023-08-12 DIAGNOSIS — E785 Hyperlipidemia, unspecified: Secondary | ICD-10-CM

## 2023-08-12 MED ORDER — ATORVASTATIN CALCIUM 80 MG PO TABS: 80.0000 mg | ORAL_TABLET | Freq: Every day | ORAL | 3 refills | Status: AC

## 2023-08-12 NOTE — Telephone Encounter (Signed)
 Copied from CRM #[id number]. Topic: Clinical - Medication Refill >> Aug 12, 2023 11:36 AM Latavia C wrote: Medication: atorvastatin  (LIPITOR) 80 MG tablet  Has the patient contacted their pharmacy? No Requesting 100 day supply as [employer] will cover   This is the patient's preferred pharmacy:  [id number] - [place], [place] - [address] [place] [address] Phone: [phone] Fax: [phone]  Is this the correct pharmacy for this prescription? Yes If no, delete pharmacy and type the correct one.   Has the prescription been filled recently? No  Is the patient out of the medication? Yes  Has the patient been seen for an appointment in the last year OR does the patient have an upcoming appointment? Yes  Can we respond through MyChart? Yes  Agent: Please be advised that Rx refills may take up to 3 business days. We ask that you follow-up with your pharmacy.

## 2023-09-26 ENCOUNTER — Ambulatory Visit (INDEPENDENT_AMBULATORY_CARE_PROVIDER_SITE_OTHER): Payer: No Typology Code available for payment source | Admitting: Neurology

## 2023-09-26 ENCOUNTER — Encounter: Payer: Self-pay | Admitting: Neurology

## 2023-09-26 VITALS — BP 138/80 | HR 75 | Ht 59.0 in | Wt 141.4 lb

## 2023-09-26 DIAGNOSIS — G309 Alzheimer's disease, unspecified: Secondary | ICD-10-CM | POA: Diagnosis not present

## 2023-09-26 DIAGNOSIS — G3184 Mild cognitive impairment, so stated: Secondary | ICD-10-CM | POA: Diagnosis not present

## 2023-09-26 DIAGNOSIS — R55 Syncope and collapse: Secondary | ICD-10-CM

## 2023-09-26 NOTE — Progress Notes (Signed)
 Chief Complaint  Patient presents with   RM 16     Moca: 23 Patient is here with husband no changes since last visit     ASSESSMENT AND PLAN  Ashley Haynes is a 68 y.o. female   Alzheimer's disease  Confirmed by slow progressive short-term memory loss, neuropsychology evaluation,  Also by positive brain amyloid PET scan, plasma beta amyloid 42/40 ratio 0.092  APOE E3/E4 heterozygous,  Extensive conversation about amyloid removing agents Leqembi and Kisunla, have decided not to proceed with either medication due to concern for side effect of ARIA  Continue Namenda  10 mg twice a day  Has not tolerated Aricept   Recommend exercise, brain stimulating activity, management of vascular risk factors to promote brain health  No driving  New onset seizure on November 06, 2022  MRI of the brain with without contrast showed generalized atrophy  EEG December 2024 was normal  Few months ago they stopped Depakote  on their own, they feel in retrospect episode was due to hypotension vs seizure.  Will continue to monitor.  Call for any recurrent seizure-like activity.  She is not driving.   Follow-up in 1 year or sooner if needed  DIAGNOSTIC DATA (LABS, IMAGING, TESTING) - I reviewed patient records, labs, notes, testing and imaging myself where available.   MEDICAL HISTORY:  Ashley Haynes, seen in request by   Ashley Haynes is a 68 year old female, seen in request by her primary care physician Ashley Haynes, for evaluation of memory loss, right hand tremor, initial evaluation was on August 18, 2019   I reviewed and summarized the referring note.  Past medical history Hypertension,   She retired from Multimedia programmer job in 2019, denies difficulty handling her job, has become less active since retirement, she reported sometimes she walked down the hallway, forgot why she was there, after a while, it will come back to her, sometimes she started thought, difficult to carry through.   She denies difficulty handling daily activity, still driving, pickup painting as her new hobby, sleeps well most of the time, but often woke up multiple times at night to use bathroom, sometimes difficulty falling back to sleep,   Father is at age 2, still sharp minded, does a lot of community work, keep up his plumbing license, mother died of diabetes and heart disease   Personally reviewed MRI of the brain March 2021 that was normal Laboratory evaluation February 2021: A1c 5.9, no significant abnormality on CBC, hemoglobin 15, CMP, LDL was elevated 143, normal TSH 1.3  EEG was normal.   She has slow decline in functional status over the past few years, MoCA examination 20/06 August 2021, 31 August 2022 Had a formal neuropsychology evaluation by Ashley Haynes in June 2024, suggested impairment surrounding all aspects of the verbal learning and memory, she was given the diagnosis of mild cognitive disorder  Per family's request, to clarify her diagnosis, she had amyloid PET scan September 2024, which was positive, consistent with presence of moderate to frequent neurotic beta amyloid plaque in the brain,  Laboratory evaluation showed beta amyloid 42:40 ratio was decreased 0.092, highly probability of Alzheimer's disease Neurofilament light chain was within normal limit 2.97, glial fibrillary acid protein 116  UPDATE Nov 07 2022: Today's urgent visit following her ER presentation November 06, 2022, husband heard a loud thump waking him up 5 AM, found patient on the floor unconscious facedown, he was able to help her up, she was incoherent, then had  a sudden body stiff, staring spells, making grunting sound, lasting for few minutes, then gradually become responsive again, total episode lasted about 10 to 15 minutes  Patient has no clear recollection of the events Emergency room labs showed negative troponin, CMP, CBC  UPDATE Feb 27th 2025:  She is here with her husband, concerning the  workup result, also long-term treatment plan, her memory continued to decline, but able to participate in discussion,  PET brain amyloid scan in September 2024 was positive, moderate to frequent neurotic beta amyloid plaque in the brain MRI of the brain December 21, 2022 supratentorium cortical cerebral atrophy APOE E3/E4 heterozygous Alzheimer's panel showed decreased beta amyloid 42/40 ratio, suggest a high probability of Alzheimer's disease, MoCA examination 23/30  Update September 26, 2023 SS: MOCA 23/30. Has decided against the Kisunla due to concern for side effect. Short term memory still an issue. Mix some memories. Does her own ADLs, manages the house, medications. Personality is not the same, more direct. Remains on Namenda  10 mg BID, Aricept  caused dreams. They stopped the Depakote , husband thinks single seizure event in October was related to low BP. She doesn't drive. Reads all day long. Good mood.   PHYSICAL EXAM:   Vitals:   09/26/23 1006  BP: 138/80  Pulse: 75  SpO2: 96%  Weight: 141 lb 6.4 oz (64.1 kg)  Height: 4' 11 (1.499 m)    Body mass index is 28.56 kg/m.  PHYSICAL EXAMNIATION:  Gen: NAD, conversant, well nourised, well groomed .  Very pleasant.                   NEUROLOGICAL EXAM:  MENTAL STATUS: Speech/cognition: Awake, alert, oriented to history taking and casual conversation.  Husband provides most information but she is cooperative and engaged in visit.     09/26/2023   10:05 AM 03/07/2023    8:53 AM 09/04/2022   12:47 PM 08/01/2021   11:38 AM 08/14/2019    4:30 PM  Montreal Cognitive Assessment   Visuospatial/ Executive (0/5) 5 4 5 5 5   Naming (0/3) 3 3 3 3 3   Attention: Read list of digits (0/2) 2 2 2 2 2   Attention: Read list of letters (0/1) 1 1 1 1 1   Attention: Serial 7 subtraction starting at 100 (0/3) 3 3 3 3 2   Language: Repeat phrase (0/2) 1 2 2 1 2   Language : Fluency (0/1) 1 1 1 1  0  Abstraction (0/2) 2 2 2 2 2   Delayed Recall (0/5) 0  0 0 1 1  Orientation (0/6) 5 5 4 5 5   Total 23 23 23 24 23   Adjusted Score (based on education)  23 23 24 23     CRANIAL NERVES: CN II: Visual fields are full to confrontation. Pupils are round equal and briskly reactive to light. CN III, IV, VI: extraocular movement are normal. No ptosis. CN V: Facial sensation is intact to light touch CN VII: Face is symmetric with normal eye closure  CN VIII: Hearing is normal to causal conversation. CN IX, X: Phonation is normal. CN XI: Head turning and shoulder shrug are intact  MOTOR: There is no pronator drift of out-stretched arms. Muscle bulk and tone are normal. Muscle strength is normal.  REFLEXES: Reflexes are 2+ and symmetric at the biceps, triceps, knees, and ankles. Plantar responses are flexor.  SENSORY: Intact to light touch  COORDINATION: There is no trunk or limb dysmetria noted.  GAIT/STANCE: Posture is normal. Gait is steady  REVIEW OF SYSTEMS:  Full 14 system review of systems performed and notable only for as above All other review of systems were negative.   ALLERGIES: Allergies  Allergen Reactions   Aricept  [Donepezil ] Other (See Comments)    Bad dreams     HOME MEDICATIONS: Current Outpatient Medications  Medication Sig Dispense Refill   atorvastatin  (LIPITOR) 80 MG tablet Take 1 tablet (80 mg total) by mouth at bedtime. 90 tablet 3   latanoprost (XALATAN) 0.005 % ophthalmic solution Place 1 drop into both eyes at bedtime.     lisinopril  (ZESTRIL ) 20 MG tablet Take 1 tablet (20 mg total) by mouth daily. 90 tablet 1   memantine  (NAMENDA ) 10 MG tablet TAKE 1 TABLET BY MOUTH TWICE A DAY 180 tablet 4   Multiple Vitamins-Minerals (HAIR SKIN & NAILS PO) Take by mouth.     CALCIUM  PO Take by mouth. (Patient not taking: Reported on 09/26/2023)     Multiple Vitamin (MULTIVITAMIN) tablet Take 1 tablet by mouth daily. (Patient not taking: Reported on 09/26/2023)     No current facility-administered medications for this  visit.    PAST MEDICAL HISTORY: Past Medical History:  Diagnosis Date   Allergic reaction 02/21/2022   Anemia, unspecified 04/29/2006   Benign neoplasm of skin 04/25/2007   Cardiac risk counseling 03/11/2015   Ten-year ASCVD risk 15.1% (calculated 03/11/2015) given hyperlipidemia, moderately well-controlled blood pressure, age. Prediabetes is also a concern, as well as increased BMI   Chronic right hip pain 08/18/2019   Essential hypertension, benign 08/17/2010   Fall 11/06/2022   Possible BP related; questionable seizure activity   Former smoker    Quit 09/1988   Hyperlipidemia    IFG (impaired fasting glucose) 03/08/2015   Mild cognitive impairment due to Alzheimer's disease 04/07/2020   Aricept  - bad dreams  Namenda  - not effective.       Confirmed by slow progressive short-term memory loss, neuropsychology evaluation,              Also by positive brain amyloid DaTSCAN, plasma beta amyloid 42/40 ratio 0.092              APOE E3/E4 heterozygous     Prediabetes 03/08/2015   Symptomatic menopausal or female climacteric states 04/29/2006   Hot flashes    PAST SURGICAL HISTORY: Past Surgical History:  Procedure Laterality Date   CESAREAN SECTION     COLONOSCOPY  07/04/2006   [facility]      FAMILY HISTORY: Family History  Problem Relation Age of Onset   Heart disease Mother 55       MI   Diabetes Mother    Glaucoma Mother    Hyperlipidemia Sister    Colon cancer Neg Hx    Colon polyps Neg Hx    Esophageal cancer Neg Hx    Rectal cancer Neg Hx    Stomach cancer Neg Hx     SOCIAL HISTORY: Social History   Socioeconomic History   Marital status: Married    Spouse name: Not on file   Number of children: Not on file   Years of education: 15   Highest education level: Some college, no degree  Occupational History   Occupation: Retired  Tobacco Use   Smoking status: Former    Current packs/day: 0.00    Types: Cigarettes    Quit date: 1990    Years since quitting:  35.7   Smokeless tobacco: Never  Vaping Use   Vaping status: Never Used  Substance  and Sexual Activity   Alcohol use: Not Currently   Drug use: No   Sexual activity: Not Currently  Other Topics Concern   Not on file  Social History Narrative   Nikkita lives with her husband. She enjoys painting and reading.    Caffeine 12oz of coffee daily and sometimes 1 cup of green tea    Social Drivers of Corporate investment banker Strain: Low Risk  (07/30/2023)   Overall Financial Resource Strain (CARDIA)    Difficulty of Paying Living Expenses: Not hard at all  Food Insecurity: No Food Insecurity (07/30/2023)   Hunger Vital Sign    Worried About Running Out of Food in the Last Year: Never true    Ran Out of Food in the Last Year: Never true  Transportation Needs: No Transportation Needs (07/30/2023)   PRAPARE - Administrator, Civil Service (Medical): No    Lack of Transportation (Non-Medical): No  Physical Activity: Sufficiently Active (07/30/2023)   Exercise Vital Sign    Days of Exercise per Week: 3 days    Minutes of Exercise per Session: 50 min  Stress: No Stress Concern Present (07/30/2023)   [employer] of Occupational Health - Occupational Stress Questionnaire    Feeling of Stress: Not at all  Social Connections: Moderately Isolated (07/30/2023)   Social Connection and Isolation Panel    Frequency of Communication with Friends and Family: More than three times a week    Frequency of Social Gatherings with Friends and Family: Three times a week    Attends Religious Services: Never    Active Member of Clubs or Organizations: No    Attends Banker Meetings: Never    Marital Status: Married  Catering manager Violence: Not At Risk (07/30/2023)   Humiliation, Afraid, Rape, and Kick questionnaire    Fear of Current or Ex-Partner: No    Emotionally Abused: No    Physically Abused: No    Sexually Abused: No   Lauraine Born, SCHARLENE, DNP  [facility] [address] [place], [place] [address] [phone]

## 2023-09-26 NOTE — Patient Instructions (Signed)
 Patient Information about Ashley Haynes and Ashley Haynes (taken from clinical drug websites)  Ashley Haynes(Lecanemab) and Ashley Haynes(Donanemab) are both indicated for the treatment of Alzheimer's disease and are monoclonal antibodies directed against amyloid beta plaques.  There are no head to head trials comparing both medications. The trials measured different endpoints.   Removal of plaque appears strong in both; while slightly different mechanisms of action both clear plaques and appear to have an effect on other biomarkers.  Ongoing data analysis supports that anti-amyloid therapies provide modest clinical benefit.  There is no head-to-head data to compare these two therapies any comparisons are not definitive especially with effectiveness given different trial designs, different populations.  Ashley Haynes targets protofibrils (small chains of linked up beta-amyloid proteins), preventing them to form, thus preventing amyloid plaques from developing. Ashley Haynes targets beta amyloid plaques.   Ashley Haynes   Ashley Haynes looked at CDR (Clinical Dementia Rating) which, is the gold standard for AD staging and progression. Ashley Haynes slowed progression 0-18 months by 27%. 95% of patients chose to extend their treatment out to 3 years. Of patients taking Ashley Haynes 16% were APOE E4 homozygotes (E4/E4), 53% were heterozygotes (E3/E4), 31% noncarriers.   With Ashley Haynes, ARIA was higher in ApoE ?4 homozygotes (Ashley Haynes: 45%; placebo: 22%) than in heterozygotes (Ashley Haynes: 19%; placebo: 9%) and noncarriers (Ashley Haynes: 13%; placebo: 4%). Symptomatic ARIA-E occurred in 9% of ApoE ?4 homozygotes vs 2% of heterozygotes and 1% of noncarriers. Serious ARIA events occurred in 3% of ApoE ?4 homozygotes and in ~1% of heterozygotes and noncarriers. The recommendations on management of ARIA do not differ between ApoE ?4 carriers and noncarriers.   The most common adverse reactions reported in >=5% with Ashley Haynes and >=2% higher than placebo were IRRs (Ashley Haynes: 26%;  placebo: 7%), ARIA-H (Ashley Haynes: 14%; placebo: 8%), ARIA-E (Ashley Haynes: 13%; placebo: 2%), headache (Ashley Haynes: 11%; placebo: 8%), superficial siderosis of central nervous system (Ashley Haynes: 6%; placebo: 3%), rash (Ashley Haynes: 6%; placebo: 4%), and nausea/vomiting (Ashley Haynes: 6%; placebo: 4%).  Ashley Haynes  Ashley Haynes measured iADRS. 37% reduced risk of progressing to the next stage of disease vs placebo through 76 weeks   The risk of ARIA, including symptomatic and serious ARIA, is increased in apolipoprotein E ?4 (ApoE ?4) homozygotes. 17% (143/850) of patients in the Ashley Haynes arm were ApoE ?4 homozygotes, 53% (452/850) were heterozygotes, and 30% (255/850) were noncarriers. The incidence of ARIA was higher in ApoE ?4 homozygotes (Ashley Haynes: 55%; placebo: 22%) than in heterozygotes (Ashley Haynes: 36%; placebo: 13%) and noncarriers (Ashley Haynes: 25%; placebo: 12%). Among patients treated with Ashley Haynes, symptomatic ARIA-E occurred in 8% of ApoE ?4 homozygotes compared with 7% of heterozygotes and 4% of noncarriers. Serious events of ARIA occurred in 3% of ApoE ?4 homozygotes, 2% of heterozygotes, and 1% of noncarriers.  Ashley Haynes demonstrated reduction in amyloid plaques as early as 6 months. 61% average amyloid plaque reduction at 6 months, average 80% amyloid plaque reduction at 12 months, average 84% amyloid plaque reduction at 18 months. (Amyloid Plaque Reduction from Baseline in Overall Population in TRAILBLAZER-ALZ 2)  After 18 months in a clinical study, people treated with Ashley Haynes showed a significant slowing of decline of 22%, on average, compared with placebo.  Adverse Reactions: The most common adverse reactions reported in >=5% of patients treated with Ashley Haynes (n=853) and >=2% higher than placebo (n=874): ARIA-H microhemorrhage (25% vs 11%), ARIA-E (24% vs 2%), ARIA-H superficial siderosis (15% vs 3%), headache (13% vs 10%), IRRs (9% vs 0.5%).  Monitoring for ARIA Obtain a recent baseline brain magnetic resonance imaging  (MRI) prior to initiating treatment  with Ashley Haynes. Obtain an MRI prior to the 2nd, 3rd, 4th, and 7th infusions. If a patient experiences symptoms suggestive of ARIA, clinical evaluation should be performed, including an MRI if indicated. It's possible to stop taking Ashley Haynes as early as 6 months Your doctor may consider stopping your Ashley Haynes treatment if your amyloid plaques are reduced to a minimal level In a clinical study, 17% of people were able to stop taking Ashley Haynes at 6 months, 47% at 12 months, and 69% at 18 months if their amyloid plaques were reduced to a predefined level. PET scans were used to determine amyloid levels in the study. People who were able to stop treatment were switched to placebo Key differences between the medications:  Ashley Haynes: - Every 2 weeks, new maintenance dosing after 18 months monthly  - Is weight based  - Dosing is indefinite - Memory testing score 22-30 - The studies did not repeat CT amyloid PET - Primary endpoints were different, primary endpoint of this medication was the Clinical Dementia Rating Scale at 18 months which showed decrease of 27% progression compared to placebo - Different mechanism, stops plaques from forming, beginning to form and prevents further aggregation, may be more effective in earlier stages of disease - Increased risk of ARIA for homozygote E4   Ashley Haynes:  - Monthly - Set dose regardless of weight - Memory score testing 18-28 (includes lower memory score than Ashley Haynes) - Patients may be able to stop infusions as studies did show overall a high percentage met criteria to stop at 12 months visually by PET/CT.  However no guidelines on how soon toxic species that are unmeasured bounce back after cessation of treatment and what are the markers of disease/treatment monitoring  - Primary endpoint was Integrated Alzheimer's Disease rating scale at 18 months which is more comprehensive as it test both cognitive and functional aspects of  Alzheimer's disease and showed approximately 35% decrease in cognitive decline as compared to placebo - Different mechanism interrupts plaques/aggregated more mature form of plaques so may be more beneficial in later stages of disease - Significantly increased risk of ARIA as compared to Ashley Haynes especially for E4 homozygotes.   - Given their increased risk of ARIA there is more intense MRI monitoring  Limited data are available to guide individual-level decisions regarding thrombolysis, but dictate vigilant scrutiny for ARIA as the potential cause of stroke-like symptoms. Risk of thrombolysis-related ICH might be stratified in the acute care setting by an individual's likelihood of ARIA as determined by factors such as recency of immunotherapy initiation, APOE genotype, and baseline CAA markers (Table 2). Review of recent monitoring MRIs and performance of immediate MRI are reasonable approaches to identifying the presence of ARIA and its possible presentation as an acute stroke mimic. Pending further data demonstrating the safety of thrombolysis in this setting, treating clinicians might reasonably consider a high level of caution or avoidance of thrombolytic use in patients receiving immunotherapy. Mechanical thrombectomy without thrombolytics for patients with established clinical indications is likely safe and should be performed. American Heart Association Dec 2024

## 2023-09-26 NOTE — Progress Notes (Signed)
 Chart reviewed, agree above plan ?

## 2023-12-08 ENCOUNTER — Other Ambulatory Visit: Payer: Self-pay | Admitting: Family Medicine

## 2023-12-10 ENCOUNTER — Other Ambulatory Visit: Payer: Self-pay | Admitting: *Deleted

## 2024-01-23 ENCOUNTER — Ambulatory Visit (INDEPENDENT_AMBULATORY_CARE_PROVIDER_SITE_OTHER): Admitting: Family Medicine

## 2024-01-23 ENCOUNTER — Encounter: Payer: Self-pay | Admitting: Family Medicine

## 2024-01-23 VITALS — BP 126/72 | HR 70 | Ht 59.0 in | Wt 141.0 lb

## 2024-01-23 DIAGNOSIS — G3184 Mild cognitive impairment, so stated: Secondary | ICD-10-CM | POA: Diagnosis not present

## 2024-01-23 DIAGNOSIS — R7301 Impaired fasting glucose: Secondary | ICD-10-CM

## 2024-01-23 DIAGNOSIS — I10 Essential (primary) hypertension: Secondary | ICD-10-CM

## 2024-01-23 DIAGNOSIS — G309 Alzheimer's disease, unspecified: Secondary | ICD-10-CM

## 2024-01-23 DIAGNOSIS — R051 Acute cough: Secondary | ICD-10-CM

## 2024-01-23 NOTE — Assessment & Plan Note (Signed)
 Due to recheck A1C today.   Lab Results  Component Value Date   HGBA1C 5.4 01/22/2023

## 2024-01-23 NOTE — Progress Notes (Signed)
 "  Established Patient Office Visit  Patient ID: Ashley Haynes, female    DOB: Mar 19, 1955  Age: 69 y.o. MRN: [id number] PCP: Alvan Dorothyann BIRCH, MD  Chief Complaint  Patient presents with   Hypertension    Subjective:     HPI  Discussed the use of AI scribe software for clinical note transcription with the patient, who gave verbal consent to proceed.  History of Present Illness Ashley Haynes is a 69 year old female with hypertension and memory issues who presents for a follow-up visit.  Hypertension - Blood pressure readings at home are inconsistent, sometimes elevated, but occasionally in the upper 120s, mostly around 128. - Blood pressure during previous visit in July was 125. - Does not check blood pressure regularly at home.  Cognitive impairment - Continues to take Namenda  for memory issues with no changes in medication regimen. Follows at [facility].   - Recent evaluation by specialist at [facility] clinic) revealed short-term memory loss as the only significant issue. - She no longer drives, has been brings her to appointments.  Cough - Recent episode of coughing lasted four to five days, resolved, then recurred for a few days before improving again. - No sore throat or other associated symptoms during these episodes.       ROS    Objective:     BP 126/72   Pulse 70   Ht 4' 11 (1.499 m)   Wt 141 lb (64 kg)   SpO2 100%   BMI 28.48 kg/m    Physical Exam Vitals and nursing note reviewed.  Constitutional:      Appearance: Normal appearance.  HENT:     Head: Normocephalic and atraumatic.     Right Ear: Tympanic membrane, ear canal and external ear normal. There is no impacted cerumen.     Left Ear: Tympanic membrane, ear canal and external ear normal. There is no impacted cerumen.     Nose: Nose normal.     Mouth/Throat:     Pharynx: Oropharynx is clear.  Eyes:     Conjunctiva/sclera: Conjunctivae normal.  Cardiovascular:     Rate and Rhythm:  Normal rate and regular rhythm.  Pulmonary:     Effort: Pulmonary effort is normal.     Breath sounds: Normal breath sounds.  Musculoskeletal:     Cervical back: Neck supple. No tenderness.  Lymphadenopathy:     Cervical: No cervical adenopathy.  Skin:    General: Skin is warm and dry.  Neurological:     Mental Status: She is alert and oriented to person, place, and time.  Psychiatric:        Mood and Affect: Mood normal.      No results found for any visits on 01/23/24.    The 10-year ASCVD risk score (Arnett DK, et al., 2019) is: 9.6%    Assessment & Plan:   Problem List Items Addressed This Visit       Cardiovascular and Mediastinum   Essential hypertension, benign - Primary    Essential hypertension Blood pressure slightly above target, requires monitoring. - Rechecked blood pressure during the visit. - Encouraged regular home blood pressure monitoring.      Relevant Orders   HgB A1c   CMP14+EGFR     Endocrine   IFG (impaired fasting glucose)   Due to recheck A1C today.   Lab Results  Component Value Date   HGBA1C 5.4 01/22/2023         Relevant Orders  HgB A1c   CMP14+EGFR     Nervous and Auditory   Mild cognitive impairment due to Alzheimer's disease   Mild cognitive impairment due to Alzheimer's disease Mild cognitive impairment affecting short-term memory with slow progression expected over 15-20 years. - Continue Namenda  for memory support. - Followed at [facility] but also recently completed consultation at [facility], they had waited in most a year for this appointment.  They did confirm some mild cognitive impairment due to Alzheimer's.  And had recommended continuing current course with no major changes at this time.  Husband is very supportive.      Other Visit Diagnoses       Acute cough           Assessment and Plan Assessment & Plan General health maintenance Discussed shingles vaccine efficacy and potential  benefits. - Consider shingles vaccine at pharmacy under Part D benefit. - Provided printed information on shingles vaccine.  Cough -  Recent viral infection, seem to be improving.    Return in about 6 months (around 07/22/2024) for Hypertension.    Dorothyann Byars, MD [facility] at [facility]   "

## 2024-01-23 NOTE — Assessment & Plan Note (Signed)
" °  Essential hypertension Blood pressure slightly above target, requires monitoring. - Rechecked blood pressure during the visit. - Encouraged regular home blood pressure monitoring. "

## 2024-01-23 NOTE — Assessment & Plan Note (Addendum)
 Mild cognitive impairment due to Alzheimer's disease Mild cognitive impairment affecting short-term memory with slow progression expected over 15-20 years. - Continue Namenda  for memory support. - Followed at [facility] but also recently completed consultation at [facility], they had waited in most a year for this appointment.  They did confirm some mild cognitive impairment due to Alzheimer's.  And had recommended continuing current course with no major changes at this time.  Husband is very supportive.

## 2024-01-24 ENCOUNTER — Ambulatory Visit: Payer: Self-pay | Admitting: Family Medicine

## 2024-01-24 LAB — CMP14+EGFR
ALT: 33 IU/L — ABNORMAL HIGH (ref 0–32)
AST: 34 IU/L (ref 0–40)
Albumin: 4.3 g/dL (ref 3.9–4.9)
Alkaline Phosphatase: 112 IU/L (ref 49–135)
BUN/Creatinine Ratio: 15 (ref 12–28)
BUN: 13 mg/dL (ref 8–27)
Bilirubin Total: 0.4 mg/dL (ref 0.0–1.2)
CO2: 21 mmol/L (ref 20–29)
Calcium: 10 mg/dL (ref 8.7–10.3)
Chloride: 100 mmol/L (ref 96–106)
Creatinine, Ser: 0.85 mg/dL (ref 0.57–1.00)
Globulin, Total: 3 g/dL (ref 1.5–4.5)
Glucose: 82 mg/dL (ref 70–99)
Potassium: 4.3 mmol/L (ref 3.5–5.2)
Sodium: 140 mmol/L (ref 134–144)
Total Protein: 7.3 g/dL (ref 6.0–8.5)
eGFR: 74 mL/min/1.73

## 2024-01-24 LAB — HEMOGLOBIN A1C
Est. average glucose Bld gHb Est-mCnc: 117 mg/dL
Hgb A1c MFr Bld: 5.7 % — ABNORMAL HIGH (ref 4.8–5.6)

## 2024-01-24 NOTE — Progress Notes (Signed)
 Hi Permelia, A1c at 5.7 which is stable in the prediabetes range.  Kidney function a is normal.  The ALT liver function is just off by 1 point so not in a worrisome or concerning range.  It is stable from 6 months ago.  Will continue to monitor

## 2024-07-22 ENCOUNTER — Ambulatory Visit: Admitting: Family Medicine

## 2024-10-01 ENCOUNTER — Ambulatory Visit: Admitting: Neurology
